# Patient Record
Sex: Female | Born: 2000 | Race: White | Hispanic: No | Smoking: Former smoker
Health system: Southern US, Community
[De-identification: ages and names within clinical notes are randomized; demographics above are authoritative.]

## PROBLEM LIST (undated history)

## (undated) DIAGNOSIS — Z789 Other specified health status: Secondary | ICD-10-CM

## (undated) DIAGNOSIS — O139 Gestational [pregnancy-induced] hypertension without significant proteinuria, unspecified trimester: Secondary | ICD-10-CM

## (undated) HISTORY — DX: Gestational (pregnancy-induced) hypertension without significant proteinuria, unspecified trimester: O13.9

## (undated) HISTORY — DX: Other specified health status: Z78.9

## (undated) HISTORY — PX: NO PAST SURGERIES: SHX2092

---
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
## 2020-07-06 NOTE — L&D Delivery Note (Addendum)
[facility]N [facility] Delivery Note  Dawn Blankenship is a 20 y.o. G1P0 s/p SVD at [redacted]w[redacted]d. She was admitted for SOL.   ROM: 20h 49m with Clear light meconium fluid GBS Status: Positive/Positive (10/12 0000) Maximum Maternal Temperature: 99.1  Labor Progress: Patient presented to L&D for SOL. Initial SVE: 4/90/-2. She then progressed to complete.   Delivery Date/Time: 05/14/21 @0947  Delivery: Called to room and patient was complete and pushing. Head delivered LOA. No nuchal cord present. Shoulder and body delivered in usual fashion. Infant with spontaneous cry, placed on mother's abdomen, dried and stimulated. Cord clamped x 2 after 1-minute delay, and cut by father of baby. Cord blood drawn. Placenta delivered spontaneously with gentle cord traction. Fundus firm with massage and Pitocin. Labia, perineum, vagina, and cervix inspected with 2nd degree vaginal laceration, right labial laceration, and left vaginal laceration requiring repair. Placenta was examined after and found to be intact with normal 3-vessel cord.   Placenta: Intact 3VC with multiple areas of calcification Complications: None Lacerations: 2nd degree vaginal repaired with 2-0 Vicryl rapide, right labial repaired with 3-0 Vicryl rapide, left vaginal laceration repaired with 2-0 Vicryl rapide EBL: Analgesia: Epidural and lidocaine  Postpartum Planning [X]  message to sent to schedule follow-up  [X]  vaccines UTD  Infant: APGARs 8/9  pending weight  , DO 05/14/2021, 11:25 AM PGY-1, [facility]  Midwife attestation: I was gloved and present for delivery in its entirety and I agree with the above resident's note.  10/9, CNM 1:33 PM

## 2020-09-13 ENCOUNTER — Ambulatory Visit (INDEPENDENT_AMBULATORY_CARE_PROVIDER_SITE_OTHER): Payer: Medicaid Other | Admitting: Adult Health

## 2020-09-13 ENCOUNTER — Encounter: Payer: Self-pay | Admitting: Adult Health

## 2020-09-13 ENCOUNTER — Other Ambulatory Visit: Payer: Self-pay

## 2020-09-13 VITALS — BP 133/72 | HR 89 | Ht 64.0 in | Wt 187.0 lb

## 2020-09-13 DIAGNOSIS — N926 Irregular menstruation, unspecified: Secondary | ICD-10-CM | POA: Insufficient documentation

## 2020-09-13 DIAGNOSIS — O3680X0 Pregnancy with inconclusive fetal viability, not applicable or unspecified: Secondary | ICD-10-CM | POA: Insufficient documentation

## 2020-09-13 DIAGNOSIS — Z3A01 Less than 8 weeks gestation of pregnancy: Secondary | ICD-10-CM | POA: Diagnosis not present

## 2020-09-13 DIAGNOSIS — O219 Vomiting of pregnancy, unspecified: Secondary | ICD-10-CM | POA: Insufficient documentation

## 2020-09-13 LAB — POCT URINE PREGNANCY: Preg Test, Ur: POSITIVE — AB

## 2020-09-13 MED ORDER — PROMETHAZINE HCL 25 MG PO TABS: 25.0000 mg | ORAL_TABLET | Freq: Four times a day (QID) | ORAL | 1 refills | Status: DC | PRN

## 2020-09-13 NOTE — Patient Instructions (Signed)
Obstetrics: Normal and Problem Pregnancies (7th ed., pp. 102-121). [place], [place]: Elsevier."> Textbook of Family Medicine (9th ed., pp. 365-410). [place], [place]: [employer].">  First Trimester of Pregnancy  The first trimester of pregnancy starts on the first day of your last menstrual period until the end of week 12. This is months 1 through 3 of pregnancy. A week after a sperm fertilizes an egg, the egg will implant into the wall of the uterus and begin to develop into a baby. By the end of 12 weeks, all the baby's organs will be formed and the baby will be 2-3 inches in size. Body changes during your first trimester Your body goes through many changes during pregnancy. The changes vary and generally return to normal after your baby is born. Physical changes  You may gain or lose weight.  Your breasts may begin to grow larger and become tender. The tissue that surrounds your nipples (areola) may become darker.  Dark spots or blotches (chloasma or mask of pregnancy) may develop on your face.  You may have changes in your hair. These can include thickening or thinning of your hair or changes in texture. Health changes  You may feel nauseous, and you may vomit.  You may have heartburn.  You may develop headaches.  You may develop constipation.  Your gums may bleed and may be sensitive to brushing and flossing. Other changes  You may tire easily.  You may urinate more often.  Your menstrual periods will stop.  You may have a loss of appetite.  You may develop cravings for certain kinds of food.  You may have changes in your emotions from day to day.  You may have more vivid and strange dreams. Follow these instructions at home: Medicines  Follow your health care provider's instructions regarding medicine use. Specific medicines may be either safe or unsafe to take during pregnancy. Do not take any medicines unless told to by your health care provider.  Take a  prenatal vitamin that contains at least 600 micrograms (mcg) of folic acid. Eating and drinking  Eat a healthy diet that includes fresh fruits and vegetables, whole grains, good sources of protein such as meat, eggs, or tofu, and low-fat dairy products.  Avoid raw meat and unpasteurized juice, milk, and cheese. These carry germs that can harm you and your baby.  If you feel nauseous or you vomit: ? Eat 4 or 5 small meals a day instead of 3 large meals. ? Try eating a few soda crackers. ? Drink liquids between meals instead of during meals.  You may need to take these actions to prevent or treat constipation: ? Drink enough fluid to keep your urine pale yellow. ? Eat foods that are high in fiber, such as beans, whole grains, and fresh fruits and vegetables. ? Limit foods that are high in fat and processed sugars, such as fried or sweet foods. Activity  Exercise only as directed by your health care provider. Most people can continue their usual exercise routine during pregnancy. Try to exercise for 30 minutes at least 5 days a week.  Stop exercising if you develop pain or cramping in the lower abdomen or lower back.  Avoid exercising if it is very hot or humid or if you are at high altitude.  Avoid heavy lifting.  If you choose to, you may have sex unless your health care provider tells you not to. Relieving pain and discomfort  Wear a good support bra to relieve breast   tenderness.  Rest with your legs elevated if you have leg cramps or low back pain.  If you develop bulging veins (varicose veins) in your legs: ? Wear support hose as told by your health care provider. ? Elevate your feet for 15 minutes, 3-4 times a day. ? Limit salt in your diet. Safety  Wear your seat belt at all times when driving or riding in a car.  Talk with your health care provider if someone is verbally or physically abusive to you.  Talk with your health care provider if you are feeling sad or have  thoughts of hurting yourself. Lifestyle  Do not use hot tubs, steam rooms, or saunas.  Do not douche. Do not use tampons or scented sanitary pads.  Do not use herbal remedies, alcohol, illegal drugs, or medicines that are not approved by your health care provider. Chemicals in these products can harm your baby.  Do not use any products that contain nicotine or tobacco, such as cigarettes, e-cigarettes, and chewing tobacco. If you need help quitting, ask your health care provider.  Avoid cat litter boxes and soil used by cats. These carry germs that can cause birth defects in the baby and possibly loss of the unborn baby (fetus) by miscarriage or stillbirth. General instructions  During routine prenatal visits in the first trimester, your health care provider will do a physical exam, perform necessary tests, and ask you how things are going. Keep all follow-up visits. This is important.  Ask for help if you have counseling or nutritional needs during pregnancy. Your health care provider can offer advice or refer you to specialists for help with various needs.  Schedule a dentist appointment. At home, brush your teeth with a soft toothbrush. Floss gently.  Write down your questions. Take them to your prenatal visits. Where to find more information  American Pregnancy Association: americanpregnancy.org  American College of Obstetricians and Gynecologists: acog.org/en/Womens%20Health/Pregnancy  Office on Women's Health: womenshealth.gov/pregnancy Contact a health care provider if you have:  Dizziness.  A fever.  Mild pelvic cramps, pelvic pressure, or nagging pain in the abdominal area.  Nausea, vomiting, or diarrhea that lasts for 24 hours or longer.  A bad-smelling vaginal discharge.  Pain when you urinate.  Known exposure to a contagious illness, such as chickenpox, measles, Zika virus, HIV, or hepatitis. Get help right away if you have:  Spotting or bleeding from your  vagina.  Severe abdominal cramping or pain.  Shortness of breath or chest pain.  Any kind of trauma, such as from a fall or a car crash.  New or increased pain, swelling, or redness in an arm or leg. Summary  The first trimester of pregnancy starts on the first day of your last menstrual period until the end of week 12 (months 1 through 3).  Eating 4 or 5 small meals a day rather than 3 large meals may help to relieve nausea and vomiting.  Do not use any products that contain nicotine or tobacco, such as cigarettes, e-cigarettes, and chewing tobacco. If you need help quitting, ask your health care provider.  Keep all follow-up visits. This is important. This information is not intended to replace advice given to you by your health care provider. Make sure you discuss any questions you have with your health care provider. Document Revised: 11/29/2019 Document Reviewed: 10/05/2019 Elsevier Patient Education  2021 Elsevier Inc.  

## 2020-09-13 NOTE — Progress Notes (Signed)
  Subjective:     Patient ID: Dawn Blankenship, female   DOB: August 22, 2000, 20 y.o.   MRN: [id number]  HPI Dawn Blankenship is a 20 year old white female, single, but engaged, in for UPT, has missed a period and had 4+HPTs. Has had nausea and vomiting and some cramping,   Review of Systems +missed period, with 4+HPTs +cramping +nasuea and vomiting +breast tenderness Reviewed past medical,surgical, social and family history. Reviewed medications and allergies.     Objective:   Physical Exam BP 133/72 (BP Location: Left Arm, Patient Position: Sitting, Cuff Size: Normal)   Pulse 89   Ht 5\' 4"  (1.626 m)   Wt 187 lb (84.8 kg)   LMP 08/01/2020 (Exact Date)   BMI 32.10 kg/m UPT is +, about 6+1 week by LMP with EDD 05/08/21.Skin warm and dry. Neck: mid line trachea, normal thyroid, good ROM, no lymphadenopathy noted. Lungs: clear to ausculation bilaterally. Cardiovascular: regular rate and rhythm. Abdomen is soft and non tender AA is 0 Fall risk is low PHQ 9 score is 4, no SI, no meds needed she says GAD 7 score is 5  [facility] - 09/13/20 1110      Pregnancy Intention Screening   Does the patient want to become pregnant in the next year? Yes    Does the patient's partner want to become pregnant in the next year? Yes    Would the patient like to discuss contraceptive options today? No      Contraception Wrap Up   Current Method Pregnant/Seeking Pregnancy    End Method Pregnant/Seeking Pregnancy    Contraception Counseling Provided No              Assessment:     1. Missed period UPT +  2. Less than [redacted] weeks gestation of pregnancy Continue PNV  3. Encounter to determine fetal viability of pregnancy, single or unspecified fetus Return in 3 weeks for dating 11/13/20  4. Nausea and vomiting during pregnancy prior to [redacted] weeks gestation Will rx phenergan Meds ordered this encounter  Medications  . promethazine (PHENERGAN) 25 MG tablet    Sig: Take 1 tablet (25 mg total) by mouth every  6 (six) hours as needed for nausea or vomiting.    Dispense:  30 tablet    Refill:  1    Order Specific Question:   Supervising Provider    Answer:   [place] [[id number]]      Plan:     Review handout on First trimester and by [facility]

## 2020-09-30 ENCOUNTER — Ambulatory Visit (INDEPENDENT_AMBULATORY_CARE_PROVIDER_SITE_OTHER): Payer: Medicaid Other

## 2020-09-30 ENCOUNTER — Other Ambulatory Visit: Payer: Self-pay

## 2020-09-30 DIAGNOSIS — O3680X0 Pregnancy with inconclusive fetal viability, not applicable or unspecified: Secondary | ICD-10-CM

## 2020-09-30 NOTE — Progress Notes (Signed)
[place] 8+4 wks,single IUP with YS,CRL 19.94 mm,fhr 173 bpm,normal ovaries

## 2020-10-07 ENCOUNTER — Other Ambulatory Visit: Payer: Medicaid Other

## 2020-10-22 ENCOUNTER — Other Ambulatory Visit: Payer: Self-pay | Admitting: Obstetrics & Gynecology

## 2020-10-22 DIAGNOSIS — Z3682 Encounter for antenatal screening for nuchal translucency: Secondary | ICD-10-CM

## 2020-10-23 ENCOUNTER — Ambulatory Visit: Payer: Medicaid Other | Admitting: *Deleted

## 2020-10-23 ENCOUNTER — Ambulatory Visit (INDEPENDENT_AMBULATORY_CARE_PROVIDER_SITE_OTHER): Payer: Medicaid Other

## 2020-10-23 ENCOUNTER — Other Ambulatory Visit: Payer: Self-pay

## 2020-10-23 ENCOUNTER — Encounter: Payer: Self-pay | Admitting: Advanced Practice Midwife

## 2020-10-23 ENCOUNTER — Ambulatory Visit (INDEPENDENT_AMBULATORY_CARE_PROVIDER_SITE_OTHER): Payer: Medicaid Other | Admitting: Advanced Practice Midwife

## 2020-10-23 VITALS — BP 122/60 | HR 67 | Wt 184.0 lb

## 2020-10-23 DIAGNOSIS — Z3682 Encounter for antenatal screening for nuchal translucency: Secondary | ICD-10-CM

## 2020-10-23 DIAGNOSIS — Z3401 Encounter for supervision of normal first pregnancy, first trimester: Secondary | ICD-10-CM | POA: Diagnosis not present

## 2020-10-23 DIAGNOSIS — Z3A11 11 weeks gestation of pregnancy: Secondary | ICD-10-CM

## 2020-10-23 DIAGNOSIS — Z3400 Encounter for supervision of normal first pregnancy, unspecified trimester: Secondary | ICD-10-CM | POA: Insufficient documentation

## 2020-10-23 LAB — POCT URINALYSIS DIPSTICK OB
Blood, UA: NEGATIVE
Glucose, UA: NEGATIVE
Ketones, UA: NEGATIVE
Leukocytes, UA: NEGATIVE
Nitrite, UA: NEGATIVE
POC,PROTEIN,UA: NEGATIVE

## 2020-10-23 MED ORDER — ASPIRIN EC 81 MG PO TBEC: 162.0000 mg | DELAYED_RELEASE_TABLET | Freq: Every day | ORAL | 6 refills | Status: DC

## 2020-10-23 NOTE — Progress Notes (Signed)
[place] 11+6 wks,measurements c/w dates,crl 55.72 mm,fhr 164 bpm,posterior placenta,normal ovaries,NT 1.2 mm,NB present

## 2020-10-23 NOTE — Patient Instructions (Signed)
Dawn Blankenship, I greatly value your feedback.  If you receive a survey following your visit with [place] today, we appreciate you taking the time to fill it out.  Thanks, Dawn Beams, DNP, CNM  [facility] HAS MOVED!!! It is now [facility] at [facility] ([address] [place], [place] [address]) Entrance located off of [address] Free 24/7 valet parking   Nausea & Vomiting  Have saltine crackers or pretzels by your bed and eat a few bites before you raise your head out of bed in the morning  Eat small frequent meals throughout the day instead of large meals  Drink plenty of fluids throughout the day to stay hydrated, just don't drink a lot of fluids with your meals.  This can make your stomach fill up faster making you feel sick  Do not brush your teeth right after you eat  Products with real ginger are good for nausea, like ginger ale and ginger hard candy Make sure it says made with real ginger!  Sucking on sour candy like lemon heads is also good for nausea  If your prenatal vitamins make you nauseated, take them at night so you will sleep through the nausea  Sea Bands  If you feel like you need medicine for the nausea & vomiting please let us know  If you are unable to keep any fluids or food down please let us know   Constipation  Drink plenty of fluid, preferably water, throughout the day  Eat foods high in fiber such as fruits, vegetables, and grains  Exercise, such as walking, is a good way to keep your bowels regular  Drink warm fluids, especially warm prune juice, or decaf coffee  Eat a 1/2 cup of real oatmeal (not instant), 1/2 cup applesauce, and 1/2-1 cup warm prune juice every day  If needed, you may take Colace (docusate sodium) stool softener once or twice a day to help keep the stool soft.   If you still are having problems with constipation, you may take Miralax once daily as needed to help keep your bowels regular.    Home Blood Pressure Monitoring for Patients   Your provider has recommended that you check your blood pressure (BP) at least once a week at home. If you do not have a blood pressure cuff at home, one will be provided for you. Contact your provider if you have not received your monitor within 1 week.   Helpful Tips for Accurate Home Blood Pressure Checks  . Don't smoke, exercise, or drink caffeine 30 minutes before checking your BP . Use the restroom before checking your BP (a full bladder can raise your pressure) . Relax in a comfortable upright chair . Feet on the ground . Left arm resting comfortably on a flat surface at the level of your heart . Legs uncrossed . Back supported . Sit quietly and don't talk . Place the cuff on your bare arm . Adjust snuggly, so that only two fingertips can fit between your skin and the top of the cuff . Check 2 readings separated by at least one minute . Keep a log of your BP readings . For a visual, please reference this diagram: http://ccnc.care/bpdiagram  Provider Name: [facility]     Phone: [phone]  Zone 1: ALL CLEAR  Continue to monitor your symptoms:  . BP reading is less than 140 (top number) or less than 90 (bottom number)  . No right upper stomach  pain . No headaches or seeing spots . No feeling nauseated or throwing up . No swelling in face and hands  Zone 2: CAUTION Call your doctor's office for any of the following:  . BP reading is greater than 140 (top number) or greater than 90 (bottom number)  . Stomach pain under your ribs in the middle or right side . Headaches or seeing spots . Feeling nauseated or throwing up . Swelling in face and hands  Zone 3: EMERGENCY  Seek immediate medical care if you have any of the following:  . BP reading is greater than160 (top number) or greater than 110 (bottom number) . Severe headaches not improving with Tylenol . Serious difficulty catching your breath . Any worsening  symptoms from Zone 2    First Trimester of Pregnancy The first trimester of pregnancy is from week 1 until the end of week 12 (months 1 through 3). A week after a sperm fertilizes an egg, the egg will implant on the wall of the uterus. This embryo will begin to develop into a baby. Genes from you and your partner are forming the baby. The female genes determine whether the baby is a boy or a girl. At 6-8 weeks, the eyes and face are formed, and the heartbeat can be seen on ultrasound. At the end of 12 weeks, all the baby's organs are formed.  Now that you are pregnant, you will want to do everything you can to have a healthy baby. Two of the most important things are to get good prenatal care and to follow your health care provider's instructions. Prenatal care is all the medical care you receive before the baby's birth. This care will help prevent, find, and treat any problems during the pregnancy and childbirth. BODY CHANGES Your body goes through many changes during pregnancy. The changes vary from woman to woman.   You may gain or lose a couple of pounds at first.  You may feel sick to your stomach (nauseous) and throw up (vomit). If the vomiting is uncontrollable, call your health care provider.  You may tire easily.  You may develop headaches that can be relieved by medicines approved by your health care provider.  You may urinate more often. Painful urination may mean you have a bladder infection.  You may develop heartburn as a result of your pregnancy.  You may develop constipation because certain hormones are causing the muscles that push waste through your intestines to slow down.  You may develop hemorrhoids or swollen, bulging veins (varicose veins).  Your breasts may begin to grow larger and become tender. Your nipples may stick out more, and the tissue that surrounds them (areola) may become darker.  Your gums may bleed and may be sensitive to brushing and flossing.  Dark  spots or blotches (chloasma, mask of pregnancy) may develop on your face. This will likely fade after the baby is born.  Your menstrual periods will stop.  You may have a loss of appetite.  You may develop cravings for certain kinds of food.  You may have changes in your emotions from day to day, such as being excited to be pregnant or being concerned that something may go wrong with the pregnancy and baby.  You may have more vivid and strange dreams.  You may have changes in your hair. These can include thickening of your hair, rapid growth, and changes in texture. Some women also have hair loss during or after pregnancy, or hair that  feels dry or thin. Your hair will most likely return to normal after your baby is born. WHAT TO EXPECT AT YOUR PRENATAL VISITS During a routine prenatal visit:  You will be weighed to make sure you and the baby are growing normally.  Your blood pressure will be taken.  Your abdomen will be measured to track your baby's growth.  The fetal heartbeat will be listened to starting around week 10 or 12 of your pregnancy.  Test results from any previous visits will be discussed. Your health care provider may ask you:  How you are feeling.  If you are feeling the baby move.  If you have had any abnormal symptoms, such as leaking fluid, bleeding, severe headaches, or abdominal cramping.  If you have any questions. Other tests that may be performed during your first trimester include:  Blood tests to find your blood type and to check for the presence of any previous infections. They will also be used to check for low iron levels (anemia) and Rh antibodies. Later in the pregnancy, blood tests for diabetes will be done along with other tests if problems develop.  Urine tests to check for infections, diabetes, or protein in the urine.  An ultrasound to confirm the proper growth and development of the baby.  An amniocentesis to check for possible genetic  problems.  Fetal screens for spina bifida and Down syndrome.  You may need other tests to make sure you and the baby are doing well. HOME CARE INSTRUCTIONS  Medicines  Follow your health care provider's instructions regarding medicine use. Specific medicines may be either safe or unsafe to take during pregnancy.  Take your prenatal vitamins as directed.  If you develop constipation, try taking a stool softener if your health care provider approves. Diet  Eat regular, well-balanced meals. Choose a variety of foods, such as meat or vegetable-based protein, fish, milk and low-fat dairy products, vegetables, fruits, and whole grain breads and cereals. Your health care provider will help you determine the amount of weight gain that is right for you.  Avoid raw meat and uncooked cheese. These carry germs that can cause birth defects in the baby.  Eating four or five small meals rather than three large meals a day may help relieve nausea and vomiting. If you start to feel nauseous, eating a few soda crackers can be helpful. Drinking liquids between meals instead of during meals also seems to help nausea and vomiting.  If you develop constipation, eat more high-fiber foods, such as fresh vegetables or fruit and whole grains. Drink enough fluids to keep your urine clear or pale yellow. Activity and Exercise  Exercise only as directed by your health care provider. Exercising will help you:  Control your weight.  Stay in shape.  Be prepared for labor and delivery.  Experiencing pain or cramping in the lower abdomen or low back is a good sign that you should stop exercising. Check with your health care provider before continuing normal exercises.  Try to avoid standing for long periods of time. Move your legs often if you must stand in one place for a long time.  Avoid heavy lifting.  Wear low-heeled shoes, and practice good posture.  You may continue to have sex unless your health care  provider directs you otherwise. Relief of Pain or Discomfort  Wear a good support bra for breast tenderness.    Take warm sitz baths to soothe any pain or discomfort caused by hemorrhoids. Use hemorrhoid cream  if your health care provider approves.    Rest with your legs elevated if you have leg cramps or low back pain.  If you develop varicose veins in your legs, wear support hose. Elevate your feet for 15 minutes, 3-4 times a day. Limit salt in your diet. Prenatal Care  Schedule your prenatal visits by the twelfth week of pregnancy. They are usually scheduled monthly at first, then more often in the last 2 months before delivery.  Write down your questions. Take them to your prenatal visits.  Keep all your prenatal visits as directed by your health care provider. Safety  Wear your seat belt at all times when driving.  Make a list of emergency phone numbers, including numbers for family, friends, the hospital, and police and fire departments. General Tips  Ask your health care provider for a referral to a local prenatal education class. Begin classes no later than at the beginning of month 6 of your pregnancy.  Ask for help if you have counseling or nutritional needs during pregnancy. Your health care provider can offer advice or refer you to specialists for help with various needs.  Do not use hot tubs, steam rooms, or saunas.  Do not douche or use tampons or scented sanitary pads.  Do not cross your legs for long periods of time.  Avoid cat litter boxes and soil used by cats. These carry germs that can cause birth defects in the baby and possibly loss of the fetus by miscarriage or stillbirth.  Avoid all smoking, herbs, alcohol, and medicines not prescribed by your health care provider. Chemicals in these affect the formation and growth of the baby.  Schedule a dentist appointment. At home, brush your teeth with a soft toothbrush and be gentle when you floss. SEEK MEDICAL  CARE IF:   You have dizziness.  You have mild pelvic cramps, pelvic pressure, or nagging pain in the abdominal area.  You have persistent nausea, vomiting, or diarrhea.  You have a bad smelling vaginal discharge.  You have pain with urination.  You notice increased swelling in your face, hands, legs, or ankles. SEEK IMMEDIATE MEDICAL CARE IF:   You have a fever.  You are leaking fluid from your vagina.  You have spotting or bleeding from your vagina.  You have severe abdominal cramping or pain.  You have rapid weight gain or loss.  You vomit blood or material that looks like coffee grounds.  You are exposed to [place] measles and have never had them.  You are exposed to fifth disease or chickenpox.  You develop a severe headache.  You have shortness of breath.  You have any kind of trauma, such as from a fall or a car accident. Document Released: 06/16/2001 Document Revised: 11/06/2013 Document Reviewed: 05/02/2013 [facility] Patient Information 2015 [place], [place]. This information is not intended to replace advice given to you by your health care provider. Make sure you discuss any questions you have with your health care provider.  Coronavirus (COVID-19) Are you at risk?  Are you at risk for the Coronavirus (COVID-19)?  To be considered HIGH RISK for Coronavirus (COVID-19), you have to meet the following criteria:  . Traveled to [place], [place], [place], [place] or [place];  and have fever, cough, and shortness of breath within the last 2 weeks of travel OR . Been in close contact with a person diagnosed with COVID-19 within the last 2 weeks and have fever, cough, and shortness of breath . IF YOU DO  NOT MEET THESE CRITERIA, YOU ARE CONSIDERED LOW RISK FOR COVID-19.  What to do if you are HIGH RISK for COVID-19?  Marland Kitchen If you are having a medical emergency, call 911. . Seek medical care right away. Before you go to a doctor's office, urgent care or emergency department,  call ahead and tell them about your recent travel, contact with someone diagnosed with COVID-19, and your symptoms. You should receive instructions from your physician's office regarding next steps of care.  . When you arrive at healthcare provider, tell the healthcare staff immediately you have returned from visiting [place], [place], [place], [place] or [place]; in the last two weeks or you have been in close contact with a person diagnosed with COVID-19 in the last 2 weeks.   . Tell the health care staff about your symptoms: fever, cough and shortness of breath. . After you have been seen by a medical provider, you will be either: o Tested for (COVID-19) and discharged home on quarantine except to seek medical care if symptoms worsen, and asked to  - Stay home and avoid contact with others until you get your results (4-5 days)  - Avoid travel on public transportation if possible (such as bus, train, or airplane) or o Sent to the Emergency Department by EMS for evaluation, COVID-19 testing, and possible admission depending on your condition and test results.  What to do if you are LOW RISK for COVID-19?  Reduce your risk of any infection by using the same precautions used for avoiding the common cold or flu:  Marland Kitchen Wash your hands often with soap and warm water for at least 20 seconds.  If soap and water are not readily available, use an alcohol-based hand sanitizer with at least 60% alcohol.  . If coughing or sneezing, cover your mouth and nose by coughing or sneezing into the elbow areas of your shirt or coat, into a tissue or into your sleeve (not your hands). . Avoid shaking hands with others and consider head nods or verbal greetings only. . Avoid touching your eyes, nose, or mouth with unwashed hands.  . Avoid close contact with people who are sick. . Avoid places or events with large numbers of people in one location, like concerts or sporting events. . Carefully consider travel plans you have or  are making. . If you are planning any travel outside or inside the [place], visit the CDC's Travelers' Health webpage for the latest health notices. . If you have some symptoms but not all symptoms, continue to monitor at home and seek medical attention if your symptoms worsen. . If you are having a medical emergency, call 911.   [place] / e-Visit: [email or web address]         [facility]: [facility]: [phone]                   [facility]: [phone]     Safe Medications in Pregnancy   Acne: Benzoyl Peroxide Salicylic Acid  Backache/Headache: Tylenol: 2 regular strength every 4 hours OR              2 Extra strength every 6 hours  Colds/Coughs/Allergies: Benadryl (alcohol free) 25 mg every 6 hours as needed Breath right strips Claritin Cepacol throat lozenges Chloraseptic throat spray Cold-Eeze- up to three times per day Cough drops, alcohol free Flonase (by prescription only) Guaifenesin Mucinex Robitussin DM (plain only, alcohol  free) Saline nasal spray/drops Sudafed (pseudoephedrine) & Actifed ** use only after [redacted] weeks gestation and if you do not have high blood pressure Tylenol Vicks Vaporub Zinc lozenges Zyrtec   Constipation: Colace Ducolax suppositories Fleet enema Glycerin suppositories Metamucil Milk of magnesia Miralax Senokot Smooth move tea  Diarrhea: Kaopectate Imodium A-D  *NO pepto Bismol  Hemorrhoids: Anusol Anusol HC Preparation H Tucks  Indigestion: Tums Maalox Mylanta Zantac  Pepcid  Insomnia: Benadryl (alcohol free) 25mg  every 6 hours as needed Tylenol PM Unisom, no Gelcaps  Leg Cramps: Tums MagGel  Nausea/Vomiting:  Bonine Dramamine Emetrol Ginger extract Sea bands Meclizine  Nausea medication to take during pregnancy:  Unisom (doxylamine succinate  25 mg tablets) Take one tablet daily at bedtime. If symptoms are not adequately controlled, the dose can be increased to a maximum recommended dose of two tablets daily (1/2 tablet in the morning, 1/2 tablet mid-afternoon and one at bedtime). Vitamin B6 100mg  tablets. Take one tablet twice a day (up to 200 mg per day).  Skin Rashes: Aveeno products Benadryl cream or 25mg  every 6 hours as needed Calamine Lotion 1% cortisone cream  Yeast infection: Gyne-lotrimin 7 Monistat 7   **If taking multiple medications, please check labels to avoid duplicating the same active ingredients **take medication as directed on the label ** Do not exceed 4000 mg of tylenol in 24 hours **Do not take medications that contain aspirin or ibuprofen

## 2020-10-23 NOTE — Progress Notes (Signed)
INITIAL OBSTETRICAL VISIT Patient name: Dawn Blankenship MRN [id number]  Date of birth: [birth year] Chief Complaint:   Initial Prenatal Visit (Nt/it)  History of Present Illness:   Dawn Blankenship is a 20 y.o. G1P0  female at [redacted]w[redacted]d by LMP c/w u/s at 8 weeks with an Estimated Date of Delivery: 05/08/21 being seen today for her initial obstetrical visit.   Her obstetrical history is significant for first pregnancy.   Today she reports heartburn.  Depression screen [facility] 2/9 10/23/2020 09/13/2020  Decreased Interest 0 1  Down, Depressed, Hopeless 1 0  PHQ - 2 Score 1 1  Altered sleeping 1 1  Tired, decreased energy 1 1  Change in appetite 0 0  Feeling bad or failure about yourself  1 0  Trouble concentrating 1 1  Moving slowly or fidgety/restless 0 0  Suicidal thoughts 0 0  PHQ-9 Score 5 4    Patient's last menstrual period was 08/01/2020 (exact date). Last pap n/a. Review of Systems:   Pertinent items are noted in HPI Denies cramping/contractions, leakage of fluid, vaginal bleeding, abnormal vaginal discharge w/ itching/odor/irritation, headaches, visual changes, shortness of breath, chest pain, abdominal pain, severe nausea/vomiting, or problems with urination or bowel movements unless otherwise stated above.  Pertinent History Reviewed:  Reviewed past medical,surgical, social, obstetrical and family history.  Reviewed problem list, medications and allergies. OB History  Gravida Para Term Preterm AB Living  1            SAB IAB Ectopic Multiple Live Births               # Outcome Date GA Lbr Len/2nd Weight Sex Delivery Anes PTL Lv  1 Current            Physical Assessment:   Vitals:   10/23/20 1057  BP: 122/60  Pulse: 67  Weight: 184 lb (83.5 kg)  Body mass index is 31.58 kg/m.       Physical Examination:  General appearance - well appearing, and in no distress  Mental status - alert, oriented to person, place, and time  Psych:  She has a normal mood and  affect  Skin - warm and dry, normal color, no suspicious lesions noted  Chest - effort normal  Heart - normal rate and regular rhythm  Abdomen - soft, nontender  Extremities:  No swelling or varicosities noted    TODAY'S NT [place] 11+6 wks,measurements c/w dates,crl 55.72 mm,fhr 164 bpm,posterior placenta,normal ovaries,NT 1.2 mm,NB present   Results for orders placed or performed in visit on 10/23/20 (from the past 24 hour(s))  POC Urinalysis Dipstick OB   Collection Time: 10/23/20 11:27 AM  Result Value Ref Range   Color, UA     Clarity, UA     Glucose, UA Negative Negative   Bilirubin, UA     Ketones, UA neg    Spec Grav, UA     Blood, UA neg    pH, UA     POC,PROTEIN,UA Negative Negative, Trace, Small (1+), Moderate (2+), Large (3+), 4+   Urobilinogen, UA     Nitrite, UA neg    Leukocytes, UA Negative Negative   Appearance     Odor      Assessment & Plan:  1) Low-Risk Pregnancy G1P0 at [redacted]w[redacted]d with an Estimated Date of Delivery: 05/08/21   2) Initial OB visit  3) Mod risk for PreE:  [place]/BMI>30;  Start asa  Meds: No orders of the defined types were placed in  this encounter.   Initial labs obtained Continue prenatal vitamins Reviewed n/v relief measures and warning s/s to report Reviewed recommended weight gain based on pre-gravid BMI Encouraged well-balanced diet Genetic & carrier screening discussed: requests Panorama, NT/IT and Horizon 14 , declines AFP Ultrasound discussed; fetal survey: requested [facility] completed> form faxed if has or is planning to apply for medicaid The nature of [place] [facility] [employer] with multiple MDs and other Advanced Practice Providers was explained to patient; also emphasized that fellows, residents, and students are part of our team. Given home bp cuff. Check bp weekly, let us know if >140/90.        Scarlette Calico Cresenzo-Dishmon 11:35 AM

## 2020-10-24 ENCOUNTER — Telehealth: Payer: Self-pay

## 2020-10-24 LAB — INTEGRATED 1

## 2020-10-24 LAB — GC/CHLAMYDIA PROBE AMP
Chlamydia trachomatis, NAA: NEGATIVE
Neisseria Gonorrhoeae by PCR: NEGATIVE

## 2020-10-24 LAB — CBC/D/PLT+RPR+RH+ABO+RUB AB...
EOS (ABSOLUTE): 0.1 10*3/uL (ref 0.0–0.4)
Eos: 1 %
Hematocrit: 37.1 % (ref 34.0–46.6)
MCV: 86 fL (ref 79–97)

## 2020-10-24 LAB — PMP SCREEN PROFILE (10S), URINE
Amphetamine Scrn, Ur: NEGATIVE ng/mL
BARBITURATE SCREEN URINE: NEGATIVE ng/mL
BENZODIAZEPINE SCREEN, URINE: NEGATIVE ng/mL
CANNABINOIDS UR QL SCN: NEGATIVE ng/mL
Cocaine (Metab) Scrn, Ur: NEGATIVE ng/mL
Creatinine(Crt), U: 44.7 mg/dL (ref 20.0–300.0)
Methadone Screen, Urine: NEGATIVE ng/mL
OXYCODONE+OXYMORPHONE UR QL SCN: NEGATIVE ng/mL
Opiate Scrn, Ur: NEGATIVE ng/mL
Ph of Urine: 8 (ref 4.5–8.9)
Phencyclidine Qn, Ur: NEGATIVE ng/mL
Propoxyphene Scrn, Ur: NEGATIVE ng/mL

## 2020-10-24 NOTE — Telephone Encounter (Signed)
Pt called with light spotting and no cramping starte dlate in the AM on 10/24/20

## 2020-10-25 LAB — INTEGRATED 1
Crown Rump Length: 55.7 mm
Gest. Age on Collection Date: 12 weeks
Maternal Age at EDD: 20 yr
Nuchal Translucency (NT): 1.2 mm
Number of Fetuses: 1
PAPP-A Value: 416.1 ng/mL
Weight: 184 [lb_av]

## 2020-10-25 LAB — CBC/D/PLT+RPR+RH+ABO+RUB AB...
Antibody Screen: NEGATIVE
Basophils Absolute: 0 10*3/uL (ref 0.0–0.2)
Basos: 0 %
HCV Ab: 0.2 s/co ratio (ref 0.0–0.9)
HIV Screen 4th Generation wRfx: NONREACTIVE
Hemoglobin: 12.3 g/dL (ref 11.1–15.9)
Hepatitis B Surface Ag: NEGATIVE
Immature Grans (Abs): 0 10*3/uL (ref 0.0–0.1)
Immature Granulocytes: 0 %
Lymphocytes Absolute: 2.8 10*3/uL (ref 0.7–3.1)
Lymphs: 28 %
MCH: 28.4 pg (ref 26.6–33.0)
MCHC: 33.2 g/dL (ref 31.5–35.7)
Monocytes Absolute: 0.6 10*3/uL (ref 0.1–0.9)
Monocytes: 6 %
Neutrophils Absolute: 6.4 10*3/uL (ref 1.4–7.0)
Neutrophils: 65 %
Platelets: 333 10*3/uL (ref 150–450)
RBC: 4.33 x10E6/uL (ref 3.77–5.28)
RDW: 12.4 % (ref 11.7–15.4)
RPR Ser Ql: NONREACTIVE
Rh Factor: POSITIVE
Rubella Antibodies, IGG: 3.47 index (ref >0.99)
WBC: 9.9 10*3/uL (ref 3.4–10.8)

## 2020-10-25 LAB — URINE CULTURE

## 2020-10-25 LAB — HCV INTERPRETATION

## 2020-11-20 ENCOUNTER — Ambulatory Visit (INDEPENDENT_AMBULATORY_CARE_PROVIDER_SITE_OTHER): Payer: Self-pay | Admitting: Advanced Practice Midwife

## 2020-11-20 ENCOUNTER — Other Ambulatory Visit: Payer: Self-pay

## 2020-11-20 ENCOUNTER — Encounter: Payer: Self-pay | Admitting: Advanced Practice Midwife

## 2020-11-20 VITALS — BP 130/64 | HR 79 | Wt 184.0 lb

## 2020-11-20 DIAGNOSIS — Z3A15 15 weeks gestation of pregnancy: Secondary | ICD-10-CM

## 2020-11-20 DIAGNOSIS — Z363 Encounter for antenatal screening for malformations: Secondary | ICD-10-CM

## 2020-11-20 DIAGNOSIS — Z3402 Encounter for supervision of normal first pregnancy, second trimester: Secondary | ICD-10-CM

## 2020-11-20 NOTE — Patient Instructions (Signed)
Dawn Blankenship, I greatly value your feedback.  If you receive a survey following your visit with [place] today, we appreciate you taking the time to fill it out.  Thanks, Philipp Deputy, CNM  [facility] ([address] [place], [place] [address]) Entrance C, located off of [address] valet parking  Go to [email or web address] to register for FREE online childbirth classes  [place] Pediatricians/Family Doctors:  [facility] [phone]            [facility] [phone]                 [facility] [phone] (usually not accepting new patients unless you have family there already, you are always welcome to call and ask)       [facility] Department [phone]       [facility] Pediatricians/Family Doctors:   [facility]: [phone]  [facility]: [phone]  [facility]: [phone]  [facility] Doctors:   [facility]: [phone]   [facility]: [phone]  [facility] Doctors:  [facility]: [phone]    Home Blood Pressure Monitoring for Patients   Your provider has recommended that you check your blood pressure (BP) at least once a week at home. If you do not have a blood pressure cuff at home, one will be provided for you. Contact your provider if you have not received your monitor within 1 week.   Helpful Tips for Accurate Home Blood Pressure Checks  . Don't smoke, exercise, or drink caffeine 30 minutes before checking your BP . Use the restroom before checking your BP (a full bladder can raise your pressure) . Relax in a comfortable upright chair . Feet on the ground . Left arm resting comfortably on a flat surface at the level of your heart . Legs uncrossed . Back supported . Sit quietly and don't talk . Place the cuff on your bare arm . Adjust snuggly, so that  only two fingertips can fit between your skin and the top of the cuff . Check 2 readings separated by at least one minute . Keep a log of your BP readings . For a visual, please reference this diagram: http://ccnc.care/bpdiagram  Provider Name: [facility]     Phone: [phone]  Zone 1: ALL CLEAR  Continue to monitor your symptoms:  . BP reading is less than 140 (top number) or less than 90 (bottom number)  . No right upper stomach pain . No headaches or seeing spots . No feeling nauseated or throwing up . No swelling in face and hands  Zone 2: CAUTION Call your doctor's office for any of the following:  . BP reading is greater than 140 (top number) or greater than 90 (bottom number)  . Stomach pain under your ribs in the middle or right side . Headaches or seeing spots . Feeling nauseated or throwing up . Swelling in face and hands  Zone 3: EMERGENCY  Seek immediate medical care if you have any of the following:  . BP reading is greater than160 (top number) or greater than 110 (bottom number) . Severe headaches not improving with Tylenol . Serious difficulty catching your breath . Any worsening symptoms from Zone 2     Second Trimester of Pregnancy The second trimester is from week 14 through week 27 (months 4 through 6). The second trimester is often a time when you feel your best.  Your body has adjusted to being pregnant, and you begin to feel better physically. Usually, morning sickness has lessened or quit completely, you may have more energy, and you may have an increase in appetite. The second trimester is also a time when the fetus is growing rapidly. At the end of the sixth month, the fetus is about 9 inches long and weighs about 1 pounds. You will likely begin to feel the baby move (quickening) between 16 and 20 weeks of pregnancy. Body changes during your second trimester Your body continues to go through many changes during your second trimester. The changes  vary from woman to woman.  Your weight will continue to increase. You will notice your lower abdomen bulging out.  You may begin to get stretch marks on your hips, abdomen, and breasts.  You may develop headaches that can be relieved by medicines. The medicines should be approved by your health care provider.  You may urinate more often because the fetus is pressing on your bladder.  You may develop or continue to have heartburn as a result of your pregnancy.  You may develop constipation because certain hormones are causing the muscles that push waste through your intestines to slow down.  You may develop hemorrhoids or swollen, bulging veins (varicose veins).  You may have back pain. This is caused by: ? Weight gain. ? Pregnancy hormones that are relaxing the joints in your pelvis. ? A shift in weight and the muscles that support your balance.  Your breasts will continue to grow and they will continue to become tender.  Your gums may bleed and may be sensitive to brushing and flossing.  Dark spots or blotches (chloasma, mask of pregnancy) may develop on your face. This will likely fade after the baby is born.  A dark line from your belly button to the pubic area (linea nigra) may appear. This will likely fade after the baby is born.  You may have changes in your hair. These can include thickening of your hair, rapid growth, and changes in texture. Some women also have hair loss during or after pregnancy, or hair that feels dry or thin. Your hair will most likely return to normal after your baby is born.  What to expect at prenatal visits During a routine prenatal visit:  You will be weighed to make sure you and the fetus are growing normally.  Your blood pressure will be taken.  Your abdomen will be measured to track your baby's growth.  The fetal heartbeat will be listened to.  Any test results from the previous visit will be discussed.  Your health care provider may  ask you:  How you are feeling.  If you are feeling the baby move.  If you have had any abnormal symptoms, such as leaking fluid, bleeding, severe headaches, or abdominal cramping.  If you are using any tobacco products, including cigarettes, chewing tobacco, and electronic cigarettes.  If you have any questions.  Other tests that may be performed during your second trimester include:  Blood tests that check for: ? Low iron levels (anemia). ? High blood sugar that affects pregnant women (gestational diabetes) between 73 and 28 weeks. ? Rh antibodies. This is to check for a protein on red blood cells (Rh factor).  Urine tests to check for infections, diabetes, or protein in the urine.  An ultrasound to confirm the proper growth and development of the baby.  An amniocentesis to check for possible genetic problems.  Fetal screens  for spina bifida and Down syndrome.  HIV (human immunodeficiency virus) testing. Routine prenatal testing includes screening for HIV, unless you choose not to have this test.  Follow these instructions at home: Medicines  Follow your health care provider's instructions regarding medicine use. Specific medicines may be either safe or unsafe to take during pregnancy.  Take a prenatal vitamin that contains at least 600 micrograms (mcg) of folic acid.  If you develop constipation, try taking a stool softener if your health care provider approves. Eating and drinking  Eat a balanced diet that includes fresh fruits and vegetables, whole grains, good sources of protein such as meat, eggs, or tofu, and low-fat dairy. Your health care provider will help you determine the amount of weight gain that is right for you.  Avoid raw meat and uncooked cheese. These carry germs that can cause birth defects in the baby.  If you have low calcium intake from food, talk to your health care provider about whether you should take a daily calcium supplement.  Limit foods  that are high in fat and processed sugars, such as fried and sweet foods.  To prevent constipation: ? Drink enough fluid to keep your urine clear or pale yellow. ? Eat foods that are high in fiber, such as fresh fruits and vegetables, whole grains, and beans. Activity  Exercise only as directed by your health care provider. Most women can continue their usual exercise routine during pregnancy. Try to exercise for 30 minutes at least 5 days a week. Stop exercising if you experience uterine contractions.  Avoid heavy lifting, wear low heel shoes, and practice good posture.  A sexual relationship may be continued unless your health care provider directs you otherwise. Relieving pain and discomfort  Wear a good support bra to prevent discomfort from breast tenderness.  Take warm sitz baths to soothe any pain or discomfort caused by hemorrhoids. Use hemorrhoid cream if your health care provider approves.  Rest with your legs elevated if you have leg cramps or low back pain.  If you develop varicose veins, wear support hose. Elevate your feet for 15 minutes, 3-4 times a day. Limit salt in your diet. Prenatal Care  Write down your questions. Take them to your prenatal visits.  Keep all your prenatal visits as told by your health care provider. This is important. Safety  Wear your seat belt at all times when driving.  Make a list of emergency phone numbers, including numbers for family, friends, the hospital, and police and fire departments. General instructions  Ask your health care provider for a referral to a local prenatal education class. Begin classes no later than the beginning of month 6 of your pregnancy.  Ask for help if you have counseling or nutritional needs during pregnancy. Your health care provider can offer advice or refer you to specialists for help with various needs.  Do not use hot tubs, steam rooms, or saunas.  Do not douche or use tampons or scented sanitary  pads.  Do not cross your legs for long periods of time.  Avoid cat litter boxes and soil used by cats. These carry germs that can cause birth defects in the baby and possibly loss of the fetus by miscarriage or stillbirth.  Avoid all smoking, herbs, alcohol, and unprescribed drugs. Chemicals in these products can affect the formation and growth of the baby.  Do not use any products that contain nicotine or tobacco, such as cigarettes and e-cigarettes. If you need help quitting,  ask your health care provider.  Visit your dentist if you have not gone yet during your pregnancy. Use a soft toothbrush to brush your teeth and be gentle when you floss. Contact a health care provider if:  You have dizziness.  You have mild pelvic cramps, pelvic pressure, or nagging pain in the abdominal area.  You have persistent nausea, vomiting, or diarrhea.  You have a bad smelling vaginal discharge.  You have pain when you urinate. Get help right away if:  You have a fever.  You are leaking fluid from your vagina.  You have spotting or bleeding from your vagina.  You have severe abdominal cramping or pain.  You have rapid weight gain or weight loss.  You have shortness of breath with chest pain.  You notice sudden or extreme swelling of your face, hands, ankles, feet, or legs.  You have not felt your baby move in over an hour.  You have severe headaches that do not go away when you take medicine.  You have vision changes. Summary  The second trimester is from week 14 through week 27 (months 4 through 6). It is also a time when the fetus is growing rapidly.  Your body goes through many changes during pregnancy. The changes vary from woman to woman.  Avoid all smoking, herbs, alcohol, and unprescribed drugs. These chemicals affect the formation and growth your baby.  Do not use any tobacco products, such as cigarettes, chewing tobacco, and e-cigarettes. If you need help quitting, ask your  health care provider.  Contact your health care provider if you have any questions. Keep all prenatal visits as told by your health care provider. This is important. This information is not intended to replace advice given to you by your health care provider. Make sure you discuss any questions you have with your health care provider. Document Released: 06/16/2001 Document Revised: 11/28/2015 Document Reviewed: 08/23/2012 Elsevier Interactive Patient Education  2017 [employer].

## 2020-11-20 NOTE — Progress Notes (Signed)
   LOW-RISK PREGNANCY VISIT Patient name: Dawn Blankenship MRN [id number]  Date of birth: September 30, 2000 Chief Complaint:   Routine Prenatal Visit  History of Present Illness:   Dawn Blankenship is a 20 y.o. G1P0 female at [redacted]w[redacted]d with an Estimated Date of Delivery: 05/08/21 being seen today for ongoing management of a low-risk pregnancy.  Today she reports no complaints. Contractions: Not present. Vag. Bleeding: None.  Movement: Present. denies leaking of fluid. Review of Systems:   Pertinent items are noted in HPI Denies abnormal vaginal discharge w/ itching/odor/irritation, headaches, visual changes, shortness of breath, chest pain, abdominal pain, severe nausea/vomiting, or problems with urination or bowel movements unless otherwise stated above. Pertinent History Reviewed:  Reviewed past medical,surgical, social, obstetrical and family history.  Reviewed problem list, medications and allergies. Physical Assessment:   Vitals:   11/20/20 1032  BP: 130/64  Pulse: 79  Weight: 184 lb (83.5 kg)  Body mass index is 31.58 kg/m.        Physical Examination:   General appearance: Well appearing, and in no distress  Mental status: Alert, oriented to person, place, and time  Skin: Warm & dry  Cardiovascular: Normal heart rate noted  Respiratory: Normal respiratory effort, no distress  Abdomen: Soft, gravid, nontender  Pelvic: Cervical exam deferred         Extremities: Edema: None  Fetal Status: Fetal Heart Rate (bpm): 147   Movement: Present    No results found for this or any previous visit (from the past 24 hour(s)).  Assessment & Plan:  1) Low-risk pregnancy G1P0 at [redacted]w[redacted]d with an Estimated Date of Delivery: 05/08/21     Meds: No orders of the defined types were placed in this encounter.  Labs/procedures today: 2nd IT  Plan:  Continue routine obstetrical care   Reviewed: Preterm labor symptoms and general obstetric precautions including but not limited to vaginal bleeding,  contractions, leaking of fluid and fetal movement were reviewed in detail with the patient.  All questions were answered. Has home bp cuff. Check bp weekly, let us know if >140/90.   Follow-up: Return in about 4 weeks (around 12/18/2020) for LROB, [place]: Anatomy.  Orders Placed This Encounter  Procedures  . US OB Comp + 14 Wk  . INTEGRATED 2   Arabella [facility] 11/20/2020 10:44 AM

## 2020-11-22 LAB — INTEGRATED 2
AFP MoM: 1.34
Alpha-Fetoprotein: 38 ng/mL
Crown Rump Length: 55.7 mm
DIA MoM: 1.58
DIA Value: 216.9 pg/mL
Estriol, Unconjugated: 0.89 ng/mL
Gest. Age on Collection Date: 12 weeks
Gestational Age: 16 weeks
Maternal Age at EDD: 20 yr
Nuchal Translucency (NT): 1.2 mm
Nuchal Translucency MoM: 0.97
Number of Fetuses: 1
PAPP-A MoM: 0.65
PAPP-A Value: 416.1 ng/mL
Test Results:: NEGATIVE
Weight: 184 [lb_av]
Weight: 184 [lb_av]
hCG MoM: 0.73
hCG Value: 23.6 IU/mL
uE3 MoM: 1.04

## 2020-12-23 ENCOUNTER — Ambulatory Visit (INDEPENDENT_AMBULATORY_CARE_PROVIDER_SITE_OTHER): Payer: Medicaid Other

## 2020-12-23 ENCOUNTER — Other Ambulatory Visit: Payer: Self-pay

## 2020-12-23 ENCOUNTER — Ambulatory Visit (INDEPENDENT_AMBULATORY_CARE_PROVIDER_SITE_OTHER): Payer: Medicaid Other | Admitting: Women's Health

## 2020-12-23 ENCOUNTER — Encounter: Payer: Self-pay | Admitting: Women's Health

## 2020-12-23 VITALS — BP 109/65 | HR 73 | Wt 188.5 lb

## 2020-12-23 DIAGNOSIS — Z363 Encounter for antenatal screening for malformations: Secondary | ICD-10-CM | POA: Diagnosis not present

## 2020-12-23 DIAGNOSIS — Z3A20 20 weeks gestation of pregnancy: Secondary | ICD-10-CM

## 2020-12-23 DIAGNOSIS — Z3402 Encounter for supervision of normal first pregnancy, second trimester: Secondary | ICD-10-CM

## 2020-12-23 NOTE — Progress Notes (Signed)
[place] 20+4 wks,cephalic,cx length 3 cm,normal ovaries,posterior placenta gr 0,SVP of fluid 4.5 cm,FHR 150 bpm,EFW 371 g 51%,anatomy complete,no obvious abnormalities

## 2020-12-23 NOTE — Patient Instructions (Signed)
Delainie, thank you for choosing our office today! We appreciate the opportunity to meet your healthcare needs. You may receive a short survey by mail, e-mail, or through MyChart. If you are happy with your care we would appreciate if you could take just a few minutes to complete the survey questions. We read all of your comments and take your feedback very seriously. Thank you again for choosing our office.  [facility] Team at [facility] [place] ([address] [place], [place] [address]) Entrance C, located off of [address] Free 24/7 valet parking  Go to [email or web address] to register for FREE online childbirth classes  Call the office ([phone]) or go to [facility] if: You begin to severe cramping Your water breaks.  Sometimes it is a big gush of fluid, sometimes it is just a trickle that keeps getting your panties wet or running down your legs You have vaginal bleeding.  It is normal to have a small amount of spotting if your cervix was checked.   [place] Pediatricians/Family Doctors [facility] ([facility]): [address], [phone]           [facility]: [address], [phone]                 [facility] ([facility]): [address], [phone] (call to ask if accepting patients) [facility]: [address], [place], [phone]    [place] Pediatricians/Family Doctors [facility] ([facility]): [address], [phone] [facility]: [address], [phone] [facility]: [address], [phone]  [place] [facility]  [facility] ([facility]): [phone] [facility]: [address], [phone]   [place] Family Doctors [facility]: [address], [phone]  [place] [facility]  [facility]: [address], [phone]  Home Blood Pressure Monitoring for Patients   Your provider has recommended that you check your blood pressure (BP) at least once a week at home. If you do not have a blood pressure cuff at home, one will be provided for you. Contact your provider if you have not received your monitor within 1 week.   Helpful Tips for Accurate Home Blood Pressure Checks  Don't smoke, exercise, or drink caffeine 30 minutes before checking your BP Use the restroom before checking your BP (a full bladder can raise your pressure) Relax in a comfortable upright chair Feet on the ground Left arm resting comfortably on a flat surface at the level of your heart Legs uncrossed Back supported Sit quietly and don't talk Place the cuff on your bare arm Adjust snuggly, so that only two fingertips can fit between your skin and the top of the cuff Check 2 readings separated by at least one minute Keep a log of your BP readings For a visual, please reference this diagram: http://[facility].care/bpdiagram  Provider Name: [facility]     Phone: [phone]  Zone 1: ALL CLEAR  Continue to monitor your symptoms:  BP reading is less than 140 (top number) or less than 90 (bottom number)  No right upper stomach pain No headaches or seeing spots No feeling nauseated or throwing up No swelling in face and hands  Zone 2: CAUTION Call your doctor's office for any of the following:  BP reading is greater than 140 (top number) or greater than   90 (bottom number)  Stomach pain under your ribs in the middle or right side Headaches or seeing spots Feeling nauseated or throwing up Swelling in face and hands  Zone 3: EMERGENCY  Seek immediate medical care if you have any of the following:  BP reading is greater than160 (top number) or greater than 110 (bottom number) Severe headaches not improving with Tylenol Serious difficulty catching your breath Any worsening symptoms from  Zone 2     Second Trimester of Pregnancy The second trimester is from week 14 through week 27 (months 4 through 6). The second trimester is often a time when you feel your best. Your body has adjusted to being pregnant, and you begin to feel better physically. Usually, morning sickness has lessened or quit completely, you may have more energy, and you may have an increase in appetite. The second trimester is also a time when the fetus is growing rapidly. At the end of the sixth month, the fetus is about 9 inches long and weighs about 1 pounds. You will likely begin to feel the baby move (quickening) between 16 and 20 weeks of pregnancy. Body changes during your second trimester Your body continues to go through many changes during your second trimester. The changes vary from woman to woman. Your weight will continue to increase. You will notice your lower abdomen bulging out. You may begin to get stretch marks on your hips, abdomen, and breasts. You may develop headaches that can be relieved by medicines. The medicines should be approved by your health care provider. You may urinate more often because the fetus is pressing on your bladder. You may develop or continue to have heartburn as a result of your pregnancy. You may develop constipation because certain hormones are causing the muscles that push waste through your intestines to slow down. You may develop hemorrhoids or swollen, bulging veins (varicose veins). You may have back pain. This is caused by: Weight gain. Pregnancy hormones that are relaxing the joints in your pelvis. A shift in weight and the muscles that support your balance. Your breasts will continue to grow and they will continue to become tender. Your gums may bleed and may be sensitive to brushing and flossing. Dark spots or blotches (chloasma, mask of pregnancy) may develop on your face. This will likely fade after the baby is born. A dark line from your belly button to  the pubic area (linea nigra) may appear. This will likely fade after the baby is born. You may have changes in your hair. These can include thickening of your hair, rapid growth, and changes in texture. Some women also have hair loss during or after pregnancy, or hair that feels dry or thin. Your hair will most likely return to normal after your baby is born.  What to expect at prenatal visits During a routine prenatal visit: You will be weighed to make sure you and the fetus are growing normally. Your blood pressure will be taken. Your abdomen will be measured to track your baby's growth. The fetal heartbeat will be listened to. Any test results from the previous visit will be discussed.  Your health care provider may ask you: How you are feeling. If you are feeling the baby move. If you have had any abnormal symptoms, such as leaking fluid, bleeding, severe headaches, or abdominal cramping. If you are using any tobacco products, including cigarettes, chewing tobacco, and electronic cigarettes. If you have any questions.  Other tests that may be performed during   your second trimester include: Blood tests that check for: Low iron levels (anemia). High blood sugar that affects pregnant women (gestational diabetes) between 24 and 28 weeks. Rh antibodies. This is to check for a protein on red blood cells (Rh factor). Urine tests to check for infections, diabetes, or protein in the urine. An ultrasound to confirm the proper growth and development of the baby. An amniocentesis to check for possible genetic problems. Fetal screens for spina bifida and Down syndrome. HIV (human immunodeficiency virus) testing. Routine prenatal testing includes screening for HIV, unless you choose not to have this test.  Follow these instructions at home: Medicines Follow your health care provider's instructions regarding medicine use. Specific medicines may be either safe or unsafe to take during  pregnancy. Take a prenatal vitamin that contains at least 600 micrograms (mcg) of folic acid. If you develop constipation, try taking a stool softener if your health care provider approves. Eating and drinking Eat a balanced diet that includes fresh fruits and vegetables, whole grains, good sources of protein such as meat, eggs, or tofu, and low-fat dairy. Your health care provider will help you determine the amount of weight gain that is right for you. Avoid raw meat and uncooked cheese. These carry germs that can cause birth defects in the baby. If you have low calcium intake from food, talk to your health care provider about whether you should take a daily calcium supplement. Limit foods that are high in fat and processed sugars, such as fried and sweet foods. To prevent constipation: Drink enough fluid to keep your urine clear or pale yellow. Eat foods that are high in fiber, such as fresh fruits and vegetables, whole grains, and beans. Activity Exercise only as directed by your health care provider. Most women can continue their usual exercise routine during pregnancy. Try to exercise for 30 minutes at least 5 days a week. Stop exercising if you experience uterine contractions. Avoid heavy lifting, wear low heel shoes, and practice good posture. A sexual relationship may be continued unless your health care provider directs you otherwise. Relieving pain and discomfort Wear a good support bra to prevent discomfort from breast tenderness. Take warm sitz baths to soothe any pain or discomfort caused by hemorrhoids. Use hemorrhoid cream if your health care provider approves. Rest with your legs elevated if you have leg cramps or low back pain. If you develop varicose veins, wear support hose. Elevate your feet for 15 minutes, 3-4 times a day. Limit salt in your diet. Prenatal Care Write down your questions. Take them to your prenatal visits. Keep all your prenatal visits as told by your health  care provider. This is important. Safety Wear your seat belt at all times when driving. Make a list of emergency phone numbers, including numbers for family, friends, the hospital, and police and fire departments. General instructions Ask your health care provider for a referral to a local prenatal education class. Begin classes no later than the beginning of month 6 of your pregnancy. Ask for help if you have counseling or nutritional needs during pregnancy. Your health care provider can offer advice or refer you to specialists for help with various needs. Do not use hot tubs, steam rooms, or saunas. Do not douche or use tampons or scented sanitary pads. Do not cross your legs for long periods of time. Avoid cat litter boxes and soil used by cats. These carry germs that can cause birth defects in the baby and possibly loss of the   fetus by miscarriage or stillbirth. Avoid all smoking, herbs, alcohol, and unprescribed drugs. Chemicals in these products can affect the formation and growth of the baby. Do not use any products that contain nicotine or tobacco, such as cigarettes and e-cigarettes. If you need help quitting, ask your health care provider. Visit your dentist if you have not gone yet during your pregnancy. Use a soft toothbrush to brush your teeth and be gentle when you floss. Contact a health care provider if: You have dizziness. You have mild pelvic cramps, pelvic pressure, or nagging pain in the abdominal area. You have persistent nausea, vomiting, or diarrhea. You have a bad smelling vaginal discharge. You have pain when you urinate. Get help right away if: You have a fever. You are leaking fluid from your vagina. You have spotting or bleeding from your vagina. You have severe abdominal cramping or pain. You have rapid weight gain or weight loss. You have shortness of breath with chest pain. You notice sudden or extreme swelling of your face, hands, ankles, feet, or legs. You  have not felt your baby move in over an hour. You have severe headaches that do not go away when you take medicine. You have vision changes. Summary The second trimester is from week 14 through week 27 (months 4 through 6). It is also a time when the fetus is growing rapidly. Your body goes through many changes during pregnancy. The changes vary from woman to woman. Avoid all smoking, herbs, alcohol, and unprescribed drugs. These chemicals affect the formation and growth your baby. Do not use any tobacco products, such as cigarettes, chewing tobacco, and e-cigarettes. If you need help quitting, ask your health care provider. Contact your health care provider if you have any questions. Keep all prenatal visits as told by your health care provider. This is important. This information is not intended to replace advice given to you by your health care provider. Make sure you discuss any questions you have with your health care provider. Document Released: 06/16/2001 Document Revised: 11/28/2015 Document Reviewed: 08/23/2012 Elsevier Interactive Patient Education  2017 [employer] Inc.  

## 2020-12-23 NOTE — Progress Notes (Signed)
LOW-RISK PREGNANCY VISIT Patient name: Dawn Blankenship MRN [id number]  Date of birth: 26-Sep-2000 Chief Complaint:   Routine Prenatal Visit ([place] today)  History of Present Illness:   Dawn Blankenship is a 20 y.o. G1P0 female at [redacted]w[redacted]d with an Estimated Date of Delivery: 05/08/21 being seen today for ongoing management of a low-risk pregnancy.   Today she reports no complaints. Interested in Systems developer. Contractions: Not present. Vag. Bleeding: None.  Movement: Present. denies leaking of fluid.  Depression screen [facility] 2/9 10/23/2020 09/13/2020  Decreased Interest 0 1  Down, Depressed, Hopeless 1 0  PHQ - 2 Score 1 1  Altered sleeping 1 1  Tired, decreased energy 1 1  Change in appetite 0 0  Feeling bad or failure about yourself  1 0  Trouble concentrating 1 1  Moving slowly or fidgety/restless 0 0  Suicidal thoughts 0 0  PHQ-9 Score 5 4     GAD 7 : Generalized Anxiety Score 10/23/2020 09/13/2020  Nervous, Anxious, on Edge 0 1  Control/stop worrying 1 1  Worry too much - different things 1 1  Trouble relaxing 1 0  Restless 0 1  Easily annoyed or irritable 1 1  Afraid - awful might happen 0 0  Total GAD 7 Score 4 5      Review of Systems:   Pertinent items are noted in HPI Denies abnormal vaginal discharge w/ itching/odor/irritation, headaches, visual changes, shortness of breath, chest pain, abdominal pain, severe nausea/vomiting, or problems with urination or bowel movements unless otherwise stated above. Pertinent History Reviewed:  Reviewed past medical,surgical, social, obstetrical and family history.  Reviewed problem list, medications and allergies. Physical Assessment:   Vitals:   12/23/20 1143  BP: 109/65  Pulse: 73  Weight: 188 lb 8 oz (85.5 kg)  Body mass index is 32.36 kg/m.        Physical Examination:   General appearance: Well appearing, and in no distress  Mental status: Alert, oriented to person, place, and time  Skin: Warm &  dry  Cardiovascular: Normal heart rate noted  Respiratory: Normal respiratory effort, no distress  Abdomen: Soft, gravid, nontender  Pelvic: Cervical exam deferred         Extremities: Edema: Trace  Fetal Status: Fetal Heart Rate (bpm): 150 u/s   Movement: Present   [place] 20+4 wks,cephalic,cx length 3 cm,normal ovaries,posterior placenta gr 0,SVP of fluid 4.5 cm,FHR 150 bpm,EFW 371 g 51%,anatomy complete,no obvious abnormalities  Chaperone: N/A   No results found for this or any previous visit (from the past 24 hour(s)).  Assessment & Plan:  1) Low-risk pregnancy G1P0 at [redacted]w[redacted]d with an Estimated Date of Delivery: 05/08/21   2) Interested in waterbirth, sign up for class   Meds: No orders of the defined types were placed in this encounter.  Labs/procedures today: U/S  Plan:  Continue routine obstetrical care  Next visit: prefers in person    Reviewed: Preterm labor symptoms and general obstetric precautions including but not limited to vaginal bleeding, contractions, leaking of fluid and fetal movement were reviewed in detail with the patient.  All questions were answered. Does have home bp cuff. Office bp cuff given: not applicable. Check bp weekly, let us know if consistently >140 and/or >90.  Follow-up: Return in about 4 weeks (around 01/20/2021) for LROB, CNM, in person.  Future Appointments  Date Time Provider Department Center  01/20/2021  9:50 AM Cheral Marker, CNM [facility]    No orders of the  defined types were placed in this encounter.  Cheral Marker CNM, [facility] 12/23/2020 12:27 PM

## 2021-01-20 ENCOUNTER — Ambulatory Visit (INDEPENDENT_AMBULATORY_CARE_PROVIDER_SITE_OTHER): Payer: Medicaid Other | Admitting: Women's Health

## 2021-01-20 ENCOUNTER — Encounter: Payer: Self-pay | Admitting: Women's Health

## 2021-01-20 ENCOUNTER — Other Ambulatory Visit: Payer: Self-pay

## 2021-01-20 VITALS — BP 118/67 | HR 90 | Wt 192.0 lb

## 2021-01-20 DIAGNOSIS — Z3402 Encounter for supervision of normal first pregnancy, second trimester: Secondary | ICD-10-CM

## 2021-01-20 DIAGNOSIS — Z3A24 24 weeks gestation of pregnancy: Secondary | ICD-10-CM

## 2021-01-20 NOTE — Progress Notes (Signed)
    LOW-RISK PREGNANCY VISIT Patient name: Dawn Blankenship MRN [id number]  Date of birth: Apr 18, 2001 Chief Complaint:   Routine Prenatal Visit  History of Present Illness:   Dawn Blankenship is a 20 y.o. G1P0 female at [redacted]w[redacted]d with an Estimated Date of Delivery: 05/08/21 being seen today for ongoing management of a low-risk pregnancy.   Today she reports no complaints. Contractions: Not present. Vag. Bleeding: None.  Movement: Present. denies leaking of fluid.  Depression screen [facility] 2/9 10/23/2020 09/13/2020  Decreased Interest 0 1  Down, Depressed, Hopeless 1 0  PHQ - 2 Score 1 1  Altered sleeping 1 1  Tired, decreased energy 1 1  Change in appetite 0 0  Feeling bad or failure about yourself  1 0  Trouble concentrating 1 1  Moving slowly or fidgety/restless 0 0  Suicidal thoughts 0 0  PHQ-9 Score 5 4     GAD 7 : Generalized Anxiety Score 10/23/2020 09/13/2020  Nervous, Anxious, on Edge 0 1  Control/stop worrying 1 1  Worry too much - different things 1 1  Trouble relaxing 1 0  Restless 0 1  Easily annoyed or irritable 1 1  Afraid - awful might happen 0 0  Total GAD 7 Score 4 5      Review of Systems:   Pertinent items are noted in HPI Denies abnormal vaginal discharge w/ itching/odor/irritation, headaches, visual changes, shortness of breath, chest pain, abdominal pain, severe nausea/vomiting, or problems with urination or bowel movements unless otherwise stated above. Pertinent History Reviewed:  Reviewed past medical,surgical, social, obstetrical and family history.  Reviewed problem list, medications and allergies. Physical Assessment:   Vitals:   01/20/21 1002  BP: 118/67  Pulse: 90  Weight: 192 lb (87.1 kg)  Body mass index is 32.96 kg/m.        Physical Examination:   General appearance: Well appearing, and in no distress  Mental status: Alert, oriented to person, place, and time  Skin: Warm & dry  Cardiovascular: Normal heart rate  noted  Respiratory: Normal respiratory effort, no distress  Abdomen: Soft, gravid, nontender  Pelvic: Cervical exam deferred         Extremities: Edema: Trace  Fetal Status: Fetal Heart Rate (bpm): 160 Fundal Height: 24 cm Movement: Present    Chaperone: N/A   No results found for this or any previous visit (from the past 24 hour(s)).  Assessment & Plan:  1) Low-risk pregnancy G1P0 at [redacted]w[redacted]d with an Estimated Date of Delivery: 05/08/21   2) Interested in waterbirth, sign up for class   Meds: No orders of the defined types were placed in this encounter.  Labs/procedures today: none  Plan:  Continue routine obstetrical care  Next visit: prefers will be in person for pn2     Reviewed: Preterm labor symptoms and general obstetric precautions including but not limited to vaginal bleeding, contractions, leaking of fluid and fetal movement were reviewed in detail with the patient.  All questions were answered. Does have home bp cuff. Office bp cuff given: not applicable. Check bp weekly, let us know if consistently >140 and/or >90.  Follow-up: Return in about 4 weeks (around 02/17/2021) for LROB, PN2, CNM, in person.  No future appointments.  No orders of the defined types were placed in this encounter.  Cheral Marker CNM, [facility] 01/20/2021 10:17 AM

## 2021-01-20 NOTE — Patient Instructions (Addendum)
Reisha, thank you for choosing our office today! We appreciate the opportunity to meet your healthcare needs. You may receive a short survey by mail, e-mail, or through [employer]. If you are happy with your care we would appreciate if you could take just a few minutes to complete the survey questions. We read all of your comments and take your feedback very seriously. Thank you again for choosing our office.  [facility] [employer] Team at [facility]  [facility] at [facility] ([address] [place], [place] [address]) Entrance C, located off of [address] Free 24/7 valet parking   You will have your sugar test next visit.  Please do not eat or drink anything after midnight the night before you come, not even water.  You will be here for at least two hours.  Please make an appointment online for the bloodwork at [email or web address] for 8:00am (or as close to this as possible). Make sure you select the [facility] service center.   CLASSES: Go to [email or web address] to register for classes (childbirth, breastfeeding, waterbirth, infant CPR, daddy bootcamp, etc.)  Call the office [phone]) or go to [facility] if: You begin to have strong, frequent contractions Your water breaks.  Sometimes it is a big gush of fluid, sometimes it is just a trickle that keeps getting your panties wet or running down your legs You have vaginal bleeding.  It is normal to have a small amount of spotting if your cervix was checked.  You don't feel your baby moving like normal.  If you don't, get you something to eat and drink and lay down and focus on feeling your baby move.   If your baby is still not moving like normal, you should call the office or go to [facility].  Call the office [phone]) or go to [facility] hospital for these signs of pre-eclampsia: Severe headache that does not go away with Tylenol Visual changes- seeing spots, double, blurred vision Pain under your right breast or upper  abdomen that does not go away with Tums or heartburn medicine Nausea and/or vomiting Severe swelling in your hands, feet, and face    [facility] Pediatricians/Family Doctors [facility]): [address] Colette Ribas, [phone]           [facility]: [address], [phone]                [facility]): [address], [phone] (call to ask if accepting patients) [facility]: [address] [place], [phone]    [facility] Pediatricians/Family Doctors [facility]): [address], [phone] [facility]: [address] [place], [phone] [facility]: [address], [phone]  [facility] Doctors  [facility]): [phone] [facility]: [address], [phone]   [facility] Doctors [facility]: [address], [phone]  [facility] Doctors  [facility]: [phone], [phone]  Home Blood Pressure Monitoring for Patients   Your provider has recommended that you check your blood pressure (BP) at least once a week at home. If you do not have a blood pressure cuff at home, one will be provided for you. Contact your provider if you have not received your monitor within 1 week.   Helpful Tips for Accurate Home Blood Pressure Checks  Don't smoke, exercise, or drink  caffeine 30 minutes before checking your BP Use the restroom before checking your BP (a full bladder can raise your pressure) Relax in a comfortable upright chair Feet on the ground Left arm resting comfortably on a flat surface at the level of your heart Legs uncrossed Back supported Sit quietly and don't talk Place the cuff on your bare arm Adjust snuggly, so that only two fingertips can fit between your skin and the top of the cuff Check 2  readings separated by at least one minute Keep a log of your BP readings For a visual, please reference this diagram: http://ccnc.care/bpdiagram  Provider Name: [facility]     Phone: [phone]  Zone 1: ALL CLEAR  Continue to monitor your symptoms:  BP reading is less than 140 (top number) or less than 90 (bottom number)  No right upper stomach pain No headaches or seeing spots No feeling nauseated or throwing up No swelling in face and hands  Zone 2: CAUTION Call your doctor's office for any of the following:  BP reading is greater than 140 (top number) or greater than 90 (bottom number)  Stomach pain under your ribs in the middle or right side Headaches or seeing spots Feeling nauseated or throwing up Swelling in face and hands  Zone 3: EMERGENCY  Seek immediate medical care if you have any of the following:  BP reading is greater than160 (top number) or greater than 110 (bottom number) Severe headaches not improving with Tylenol Serious difficulty catching your breath Any worsening symptoms from Zone 2   Second Trimester of Pregnancy The second trimester is from week 13 through week 28, months 4 through 6. The second trimester is often a time when you feel your best. Your body has also adjusted to being pregnant, and you begin to feel better physically. Usually, morning sickness has lessened or quit completely, you may have more energy, and you may have an increase in appetite. The second trimester is also a time when the fetus is growing rapidly. At the end of the sixth month, the fetus is about 9 inches long and weighs about 1 pounds. You will likely begin to feel the baby move (quickening) between 18 and 20 weeks of the pregnancy. BODY CHANGES Your body goes through many changes during pregnancy. The changes vary from woman to woman.  Your weight will continue to increase. You will notice your lower abdomen bulging out. You may begin to get stretch marks on your  hips, abdomen, and breasts. You may develop headaches that can be relieved by medicines approved by your health care provider. You may urinate more often because the fetus is pressing on your bladder. You may develop or continue to have heartburn as a result of your pregnancy. You may develop constipation because certain hormones are causing the muscles that push waste through your intestines to slow down. You may develop hemorrhoids or swollen, bulging veins (varicose veins). You may have back pain because of the weight gain and pregnancy hormones relaxing your joints between the bones in your pelvis and as a result of a shift in weight and the muscles that support your balance. Your breasts will continue to grow and be tender. Your gums may bleed and may be sensitive to brushing and flossing. Dark spots or blotches (chloasma, mask of pregnancy) may develop on your face. This will likely fade after the baby is born. A dark line from your belly button to the pubic area (linea nigra) may appear. This  will likely fade after the baby is born. You may have changes in your hair. These can include thickening of your hair, rapid growth, and changes in texture. Some women also have hair loss during or after pregnancy, or hair that feels dry or thin. Your hair will most likely return to normal after your baby is born. WHAT TO EXPECT AT YOUR PRENATAL VISITS During a routine prenatal visit: You will be weighed to make sure you and the fetus are growing normally. Your blood pressure will be taken. Your abdomen will be measured to track your baby's growth. The fetal heartbeat will be listened to. Any test results from the previous visit will be discussed. Your health care provider may ask you: How you are feeling. If you are feeling the baby move. If you have had any abnormal symptoms, such as leaking fluid, bleeding, severe headaches, or abdominal cramping. If you have any questions. Other tests that may  be performed during your second trimester include: Blood tests that check for: Low iron levels (anemia). Gestational diabetes (between 24 and 28 weeks). Rh antibodies. Urine tests to check for infections, diabetes, or protein in the urine. An ultrasound to confirm the proper growth and development of the baby. An amniocentesis to check for possible genetic problems. Fetal screens for spina bifida and Down syndrome. HOME CARE INSTRUCTIONS  Avoid all smoking, herbs, alcohol, and unprescribed drugs. These chemicals affect the formation and growth of the baby. Follow your health care provider's instructions regarding medicine use. There are medicines that are either safe or unsafe to take during pregnancy. Exercise only as directed by your health care provider. Experiencing uterine cramps is a good sign to stop exercising. Continue to eat regular, healthy meals. Wear a good support bra for breast tenderness. Do not use hot tubs, steam rooms, or saunas. Wear your seat belt at all times when driving. Avoid raw meat, uncooked cheese, cat litter boxes, and soil used by cats. These carry germs that can cause birth defects in the baby. Take your prenatal vitamins. Try taking a stool softener (if your health care provider approves) if you develop constipation. Eat more high-fiber foods, such as fresh vegetables or fruit and whole grains. Drink plenty of fluids to keep your urine clear or pale yellow. Take warm sitz baths to soothe any pain or discomfort caused by hemorrhoids. Use hemorrhoid cream if your health care provider approves. If you develop varicose veins, wear support hose. Elevate your feet for 15 minutes, 3-4 times a day. Limit salt in your diet. Avoid heavy lifting, wear low heel shoes, and practice good posture. Rest with your legs elevated if you have leg cramps or low back pain. Visit your dentist if you have not gone yet during your pregnancy. Use a soft toothbrush to brush your teeth  and be gentle when you floss. A sexual relationship may be continued unless your health care provider directs you otherwise. Continue to go to all your prenatal visits as directed by your health care provider. SEEK MEDICAL CARE IF:  You have dizziness. You have mild pelvic cramps, pelvic pressure, or nagging pain in the abdominal area. You have persistent nausea, vomiting, or diarrhea. You have a bad smelling vaginal discharge. You have pain with urination. SEEK IMMEDIATE MEDICAL CARE IF:  You have a fever. You are leaking fluid from your vagina. You have spotting or bleeding from your vagina. You have severe abdominal cramping or pain. You have rapid weight gain or loss. You have shortness of   breath with chest pain. You notice sudden or extreme swelling of your face, hands, ankles, feet, or legs. You have not felt your baby move in over an hour. You have severe headaches that do not go away with medicine. You have vision changes. Document Released: 06/16/2001 Document Revised: 06/27/2013 Document Reviewed: 08/23/2012 [facility] Patient Information 2015 [place], [place]. This information is not intended to replace advice given to you by your health care provider. Make sure you discuss any questions you have with your health care provider.   Considering Waterbirth? Guide for patients at [facility] for [employer] [facility]) Why consider waterbirth? Gentle birth for babies  Less pain medicine used in labor  May allow for passive descent/less pushing  May reduce perineal tears  More mobility and instinctive maternal position changes  Increased maternal relaxation   Is waterbirth safe? What are the risks of infection, drowning or other complications? Infection:  Very low risk (3.7 % for tub vs 4.8% for bed)  7 in 8000 waterbirths with documented infection  Poorly cleaned equipment most common cause  Slightly lower group B strep transmission rate  Drowning  Maternal:  Very low risk   Related to seizures or fainting  Newborn:  Very low risk. No evidence of increased risk of respiratory problems in multiple large studies  Physiological protection from breathing under water  Avoid underwater birth if there are any fetal complications  Once baby's head is out of the water, keep it out.  Birth complication  Some reports of cord trauma, but risk decreased by bringing baby to surface gradually  No evidence of increased risk of shoulder dystocia. Mothers can usually change positions faster in water than in a bed, possibly aiding the maneuvers to free the shoulder.   There are 2 things you MUST do to have a waterbirth with [facility]: Attend a waterbirth class at [employer] [facility]   3rd Wednesday of every month from 7-9 pm (virtual during COVID) [employer] at [email or web address] or [email or web address] or by calling [phone] Bring [place] the certificate from the class to your prenatal appointment or send via MyChart Meet with a midwife at 36 weeks* to see if you can still plan a waterbirth and to sign the consent.   *We also recommend that you schedule as many of your prenatal visits with a midwife as possible.    Helpful information: You may want to bring a bathing suit top to the hospital to wear during labor but this is optional.  All other supplies are provided by the hospital. Please arrive at the hospital with signs of active labor, and do not wait at home until late in labor. It takes 45 min- 2 hours for COVID testing, fetal monitoring, and check in to your room to take place, plus transport and filling of the waterbirth tub.    Things that would prevent you from having a waterbirth: Unknown or Positive COVID-19 diagnosis upon admission to hospital* Premature, <37wks  Previous cesarean birth  Presence of thick meconium-stained fluid  Multiple gestation (Twins, triplets, etc.)  Uncontrolled diabetes or gestational diabetes  requiring medication  Hypertension diagnosed in pregnancy or preexisting hypertension (gestational hypertension, preeclampsia, or chronic hypertension) Fetal growth restriction (your baby measures less than 10th percentile on ultrasound) Heavy vaginal bleeding  Non-reassuring fetal heart rate  Active infection (MRSA, etc.). Group B Strep is NOT a contraindication for waterbirth.  If your labor has to be induced and induction method requires continuous monitoring of the baby's  heart rate  Other risks/issues identified by your obstetrical provider   Please remember that birth is unpredictable. Under certain unforeseeable circumstances your provider may advise against giving birth in the tub. These decisions will be made on a case-by-case basis and with the safety of you and your baby as our highest priority.   *Please remember that in order to have a waterbirth, you must test Negative to COVID-19 upon admission to the hospital.  Updated 10/14/20

## 2021-02-18 ENCOUNTER — Encounter: Payer: Medicaid Other | Admitting: Women's Health

## 2021-02-18 ENCOUNTER — Other Ambulatory Visit: Payer: Medicaid Other

## 2021-02-24 ENCOUNTER — Other Ambulatory Visit: Payer: Self-pay

## 2021-02-24 ENCOUNTER — Encounter: Payer: Self-pay | Admitting: Obstetrics & Gynecology

## 2021-02-24 ENCOUNTER — Other Ambulatory Visit: Payer: Medicaid Other

## 2021-02-24 ENCOUNTER — Ambulatory Visit (INDEPENDENT_AMBULATORY_CARE_PROVIDER_SITE_OTHER): Payer: Medicaid Other | Admitting: Obstetrics & Gynecology

## 2021-02-24 VITALS — BP 119/72 | HR 92 | Wt 196.0 lb

## 2021-02-24 DIAGNOSIS — Z3A29 29 weeks gestation of pregnancy: Secondary | ICD-10-CM

## 2021-02-24 DIAGNOSIS — Z3403 Encounter for supervision of normal first pregnancy, third trimester: Secondary | ICD-10-CM

## 2021-02-24 NOTE — Progress Notes (Signed)
   LOW-RISK PREGNANCY VISIT Patient name: Dawn Blankenship MRN [id number]  Date of birth: 2000-12-21 Chief Complaint:   Routine Prenatal Visit (PN2 today)  History of Present Illness:   Dawn Blankenship is a 20 y.o. G1P0 female at [redacted]w[redacted]d with an Estimated Date of Delivery: 05/08/21 being seen today for ongoing management of a low-risk pregnancy.  Depression screen [facility] 2/9 02/24/2021 10/23/2020 09/13/2020  Decreased Interest 0 0 1  Down, Depressed, Hopeless 0 1 0  PHQ - 2 Score 0 1 1  Altered sleeping 1 1 1   Tired, decreased energy 0 1 1  Change in appetite 0 0 0  Feeling bad or failure about yourself  0 1 0  Trouble concentrating 0 1 1  Moving slowly or fidgety/restless 0 0 0  Suicidal thoughts 0 0 0  PHQ-9 Score 1 5 4     Today she reports no complaints. Contractions: Not present. Vag. Bleeding: None.  Movement: Present. denies leaking of fluid. Review of Systems:   Pertinent items are noted in HPI Denies abnormal vaginal discharge w/ itching/odor/irritation, headaches, visual changes, shortness of breath, chest pain, abdominal pain, severe nausea/vomiting, or problems with urination or bowel movements unless otherwise stated above. Pertinent History Reviewed:  Reviewed past medical,surgical, social, obstetrical and family history.  Reviewed problem list, medications and allergies. Physical Assessment:   Vitals:   02/24/21 0910  BP: 119/72  Pulse: 92  Weight: 196 lb (88.9 kg)  Body mass index is 33.64 kg/m.        Physical Examination:   General appearance: Well appearing, and in no distress  Mental status: Alert, oriented to person, place, and time  Skin: Warm & dry  Cardiovascular: Normal heart rate noted  Respiratory: Normal respiratory effort, no distress  Abdomen: Soft, gravid, nontender  Pelvic: Cervical exam deferred         Extremities: Edema: Trace  Fetal Status: Fetal Heart Rate (bpm): 155 Fundal Height: 29 cm Movement: Present    Chaperone: n/a    No  results found for this or any previous visit (from the past 24 hour(s)).  Assessment & Plan:  1) Low-risk pregnancy G1P0 at [redacted]w[redacted]d with an Estimated Date of Delivery: 05/08/21   2) ,    Meds: No orders of the defined types were placed in this encounter.  Labs/procedures today: PN2  Plan:  Continue routine obstetrical care  Next visit: prefers in person    Reviewed: Preterm labor symptoms and general obstetric precautions including but not limited to vaginal bleeding, contractions, leaking of fluid and fetal movement were reviewed in detail with the patient.  All questions were answered. Has home bp cuff. Rx faxed to . Check bp weekly, let [redacted]w[redacted]d know if >140/90.   Follow-up: Return in about 3 weeks (around 03/17/2021) for LROB.  No orders of the defined types were placed in this encounter.   [place], MD 02/24/2021 9:57 AM

## 2021-02-25 LAB — RPR: RPR Ser Ql: NONREACTIVE

## 2021-02-25 LAB — CBC
Hematocrit: 33.6 % — ABNORMAL LOW (ref 34.0–46.6)
Hemoglobin: 11.3 g/dL (ref 11.1–15.9)
MCH: 28.6 pg (ref 26.6–33.0)
MCHC: 33.6 g/dL (ref 31.5–35.7)
MCV: 85 fL (ref 79–97)
Platelets: 292 10*3/uL (ref 150–450)
RBC: 3.95 x10E6/uL (ref 3.77–5.28)
RDW: 12.6 % (ref 11.7–15.4)
WBC: 13 10*3/uL — ABNORMAL HIGH (ref 3.4–10.8)

## 2021-02-25 LAB — GLUCOSE TOLERANCE, 2 HOURS W/ 1HR
Glucose, 1 hour: 105 mg/dL (ref 65–179)
Glucose, 2 hour: 121 mg/dL (ref 65–152)
Glucose, Fasting: 86 mg/dL (ref 65–91)

## 2021-02-25 LAB — HIV ANTIBODY (ROUTINE TESTING W REFLEX): HIV Screen 4th Generation wRfx: NONREACTIVE

## 2021-02-25 LAB — ANTIBODY SCREEN: Antibody Screen: NEGATIVE

## 2021-03-19 ENCOUNTER — Other Ambulatory Visit: Payer: Self-pay

## 2021-03-19 ENCOUNTER — Encounter: Payer: Self-pay | Admitting: Women's Health

## 2021-03-19 ENCOUNTER — Ambulatory Visit (INDEPENDENT_AMBULATORY_CARE_PROVIDER_SITE_OTHER): Payer: Medicaid Other | Admitting: Women's Health

## 2021-03-19 VITALS — BP 122/77 | HR 86 | Wt 200.0 lb

## 2021-03-19 DIAGNOSIS — Z3403 Encounter for supervision of normal first pregnancy, third trimester: Secondary | ICD-10-CM

## 2021-03-19 DIAGNOSIS — Z3A32 32 weeks gestation of pregnancy: Secondary | ICD-10-CM | POA: Diagnosis not present

## 2021-03-19 DIAGNOSIS — Z23 Encounter for immunization: Secondary | ICD-10-CM | POA: Diagnosis not present

## 2021-03-19 NOTE — Patient Instructions (Signed)
Jeanine, thank you for choosing our office today! We appreciate the opportunity to meet your healthcare needs. You may receive a short survey by mail, e-mail, or through [employer]. If you are happy with your care we would appreciate if you could take just a few minutes to complete the survey questions. We read all of your comments and take your feedback very seriously. Thank you again for choosing our office.  Center for [employer] Team at [facility]  [facility] at [facility] ([address] [place], [place] [address]) Entrance C, located off of [address] Free 24/7 valet parking   CLASSES: Go to [email or web address] to register for classes (childbirth, breastfeeding, waterbirth, infant CPR, daddy bootcamp, etc.)  Call the office [phone]) or go to [facility] if: You begin to have strong, frequent contractions Your water breaks.  Sometimes it is a big gush of fluid, sometimes it is just a trickle that keeps getting your panties wet or running down your legs You have vaginal bleeding.  It is normal to have a small amount of spotting if your cervix was checked.  You don't feel your baby moving like normal.  If you don't, get you something to eat and drink and lay down and focus on feeling your baby move.   If your baby is still not moving like normal, you should call the office or go to [facility].  Call the office [phone]) or go to [facility] hospital for these signs of pre-eclampsia: Severe headache that does not go away with Tylenol Visual changes- seeing spots, double, blurred vision Pain under your right breast or upper abdomen that does not go away with Tums or heartburn medicine Nausea and/or vomiting Severe swelling in your hands, feet, and face   Tdap Vaccine It is recommended that you get the Tdap vaccine during the third trimester of EACH pregnancy to help protect your baby from getting pertussis (whooping cough) 27-36 weeks is the BEST time to do  this so that you can pass the protection on to your baby. During pregnancy is better than after pregnancy, but if you are unable to get it during pregnancy it will be offered at the hospital.  You can get this vaccine with [place], at the health department, your family doctor, or some local pharmacies Everyone who will be around your baby should also be up-to-date on their vaccines before the baby comes. Adults (who are not pregnant) only need 1 dose of Tdap during adulthood.   [facility] Pediatricians/Family Doctors [facility]): [address] Colette Ribas, [phone]           [facility]: [address], [phone]                [facility]): [address], [phone] (call to ask if accepting patients) [facility] Department: [address], [place], [phone]    [facility] Pediatricians/Family Doctors [facility]): [phone] [address], [phone] [facility]: [address] [place], [phone] [facility] [place]: [address], [phone]  [facility] Doctors  [facility]): [phone] [facility]: [address] [phone]   [facility] Doctors [facility]: [address] [phone]  [facility] Family Doctors  [facility]: [phone], [phone]  Home Blood Pressure Monitoring for Patients   Your provider has recommended that you check your  blood pressure (BP) at least once a week at home. If you do not have a blood pressure cuff at home, one will be provided for you. Contact your provider if you have not received your monitor within 1 week.   Helpful Tips for Accurate Home Blood Pressure Checks  Don't smoke, exercise, or drink caffeine 30 minutes before checking your BP Use the restroom before checking your BP (a full bladder can raise your  pressure) Relax in a comfortable upright chair Feet on the ground Left arm resting comfortably on a flat surface at the level of your heart Legs uncrossed Back supported Sit quietly and don't talk Place the cuff on your bare arm Adjust snuggly, so that only two fingertips can fit between your skin and the top of the cuff Check 2 readings separated by at least one minute Keep a log of your BP readings For a visual, please reference this diagram: http://ccnc.care/bpdiagram  Provider Name: [facility]     Phone: [phone]  Zone 1: ALL CLEAR  Continue to monitor your symptoms:  BP reading is less than 140 (top number) or less than 90 (bottom number)  No right upper stomach pain No headaches or seeing spots No feeling nauseated or throwing up No swelling in face and hands  Zone 2: CAUTION Call your doctor's office for any of the following:  BP reading is greater than 140 (top number) or greater than 90 (bottom number)  Stomach pain under your ribs in the middle or right side Headaches or seeing spots Feeling nauseated or throwing up Swelling in face and hands  Zone 3: EMERGENCY  Seek immediate medical care if you have any of the following:  BP reading is greater than160 (top number) or greater than 110 (bottom number) Severe headaches not improving with Tylenol Serious difficulty catching your breath Any worsening symptoms from Zone 2  Preterm Labor and Birth Information  The normal length of a pregnancy is 39-41 weeks. Preterm labor is when labor starts before 37 completed weeks of pregnancy. What are the risk factors for preterm labor? Preterm labor is more likely to occur in women who: Have certain infections during pregnancy such as a bladder infection, sexually transmitted infection, or infection inside the uterus (chorioamnionitis). Have a shorter-than-normal cervix. Have gone into preterm labor before. Have had surgery on their cervix. Are younger than age 17  or older than age 35. Are African American. Are pregnant with twins or multiple babies (multiple gestation). Take street drugs or smoke while pregnant. Do not gain enough weight while pregnant. Became pregnant shortly after having been pregnant. What are the symptoms of preterm labor? Symptoms of preterm labor include: Cramps similar to those that can happen during a menstrual period. The cramps may happen with diarrhea. Pain in the abdomen or lower back. Regular uterine contractions that may feel like tightening of the abdomen. A feeling of increased pressure in the pelvis. Increased watery or bloody mucus discharge from the vagina. Water breaking (ruptured amniotic sac). Why is it important to recognize signs of preterm labor? It is important to recognize signs of preterm labor because babies who are born prematurely may not be fully developed. This can put them at an increased risk for: Long-term (chronic) heart and lung problems. Difficulty immediately after birth with regulating body systems, including blood sugar, body temperature, heart rate, and breathing rate. Bleeding in the brain. Cerebral palsy. Learning difficulties. Death. These risks are highest for babies who are born before 34 weeks   of pregnancy. How is preterm labor treated? Treatment depends on the length of your pregnancy, your condition, and the health of your baby. It may involve: Having a stitch (suture) placed in your cervix to prevent your cervix from opening too early (cerclage). Taking or being given medicines, such as: Hormone medicines. These may be given early in pregnancy to help support the pregnancy. Medicine to stop contractions. Medicines to help mature the baby's lungs. These may be prescribed if the risk of delivery is high. Medicines to prevent your baby from developing cerebral palsy. If the labor happens before 34 weeks of pregnancy, you may need to stay in the hospital. What should I do if I  think I am in preterm labor? If you think that you are going into preterm labor, call your health care provider right away. How can I prevent preterm labor in future pregnancies? To increase your chance of having a full-term pregnancy: Do not use any tobacco products, such as cigarettes, chewing tobacco, and e-cigarettes. If you need help quitting, ask your health care provider. Do not use street drugs or medicines that have not been prescribed to you during your pregnancy. Talk with your health care provider before taking any herbal supplements, even if you have been taking them regularly. Make sure you gain a healthy amount of weight during your pregnancy. Watch for infection. If you think that you might have an infection, get it checked right away. Make sure to tell your health care provider if you have gone into preterm labor before. This information is not intended to replace advice given to you by your health care provider. Make sure you discuss any questions you have with your health care provider. Document Revised: 10/14/2018 Document Reviewed: 11/13/2015 Elsevier Patient Education  2020 Elsevier Inc.   

## 2021-03-19 NOTE — Progress Notes (Signed)
LOW-RISK PREGNANCY VISIT Patient name: Dawn Blankenship MRN [id number]  Date of birth: 06-16-2001 Chief Complaint:   Routine Prenatal Visit  History of Present Illness:   Dawn Blankenship is a 20 y.o. G1P0 female at [redacted]w[redacted]d with an Estimated Date of Delivery: 05/08/21 being seen today for ongoing management of a low-risk pregnancy.   Today she reports no complaints. Contractions: Not present. Vag. Bleeding: None.  Movement: Present. denies leaking of fluid.  Depression screen [facility] 2/9 02/24/2021 10/23/2020 09/13/2020  Decreased Interest 0 0 1  Down, Depressed, Hopeless 0 1 0  PHQ - 2 Score 0 1 1  Altered sleeping 1 1 1   Tired, decreased energy 0 1 1  Change in appetite 0 0 0  Feeling bad or failure about yourself  0 1 0  Trouble concentrating 0 1 1  Moving slowly or fidgety/restless 0 0 0  Suicidal thoughts 0 0 0  PHQ-9 Score 1 5 4      GAD 7 : Generalized Anxiety Score 02/24/2021 10/23/2020 09/13/2020  Nervous, Anxious, on Edge 1 0 1  Control/stop worrying 0 1 1  Worry too much - different things 0 1 1  Trouble relaxing 1 1 0  Restless 0 0 1  Easily annoyed or irritable 1 1 1   Afraid - awful might happen 0 0 0  Total GAD 7 Score 3 4 5       Review of Systems:   Pertinent items are noted in HPI Denies abnormal vaginal discharge w/ itching/odor/irritation, headaches, visual changes, shortness of breath, chest pain, abdominal pain, severe nausea/vomiting, or problems with urination or bowel movements unless otherwise stated above. Pertinent History Reviewed:  Reviewed past medical,surgical, social, obstetrical and family history.  Reviewed problem list, medications and allergies. Physical Assessment:   Vitals:   03/19/21 1002  BP: 122/77  Pulse: 86  Weight: 200 lb (90.7 kg)  Body mass index is 34.33 kg/m.        Physical Examination:   General appearance: Well appearing, and in no distress  Mental status: Alert, oriented to person, place, and time  Skin: Warm &  dry  Cardiovascular: Normal heart rate noted  Respiratory: Normal respiratory effort, no distress  Abdomen: Soft, gravid, nontender  Pelvic: Cervical exam deferred         Extremities: Edema: None  Fetal Status: Fetal Heart Rate (bpm): 152 Fundal Height: 33 cm Movement: Present    Chaperone: N/A   No results found for this or any previous visit (from the past 24 hour(s)).  Assessment & Plan:  1) Low-risk pregnancy G1P0 at [redacted]w[redacted]d with an Estimated Date of Delivery: 05/08/21   2) Interested in , hasn't signed up for class, do this asap and send 03/21/21 certificate   Meds: No orders of the defined types were placed in this encounter.  Labs/procedures today: declined flu shot  Plan:  Continue routine obstetrical care  Next visit: prefers in person    Reviewed: Preterm labor symptoms and general obstetric precautions including but not limited to vaginal bleeding, contractions, leaking of fluid and fetal movement were reviewed in detail with the patient.  All questions were answered. Does have home bp cuff. Office bp cuff given: not applicable. Check bp weekly, let [redacted]w[redacted]d know if consistently >140 and/or >90.  Follow-up: Return in about 2 weeks (around 04/02/2021) for LROB, CNM, in person.  Future Appointments  Date Time Provider Department Center  04/02/2021  3:10 PM [place], CNM [facility]    Orders  Placed This Encounter  Procedures   Tdap vaccine greater than or equal to 7yo IM   Cheral Marker CNM, [facility] 03/19/2021 10:42 AM

## 2021-04-02 ENCOUNTER — Ambulatory Visit (INDEPENDENT_AMBULATORY_CARE_PROVIDER_SITE_OTHER): Payer: Medicaid Other | Admitting: Advanced Practice Midwife

## 2021-04-02 ENCOUNTER — Other Ambulatory Visit: Payer: Self-pay

## 2021-04-02 ENCOUNTER — Encounter: Payer: Self-pay | Admitting: Advanced Practice Midwife

## 2021-04-02 VITALS — BP 135/84 | HR 123 | Wt 205.0 lb

## 2021-04-02 DIAGNOSIS — Z3A34 34 weeks gestation of pregnancy: Secondary | ICD-10-CM

## 2021-04-02 DIAGNOSIS — Z3403 Encounter for supervision of normal first pregnancy, third trimester: Secondary | ICD-10-CM

## 2021-04-02 NOTE — Progress Notes (Signed)
   LOW-RISK PREGNANCY VISIT Patient name: Dawn Blankenship MRN [id number]  Date of birth: 11/10/2000 Chief Complaint:   Routine Prenatal Visit (Swab for yeast)  History of Present Illness:   Dawn Blankenship is a 20 y.o. G1P0 female at [redacted]w[redacted]d with an Estimated Date of Delivery: 05/08/21 being seen today for ongoing management of a low-risk pregnancy.  Today she reports no complaints. Contractions: Not present.  .  Movement: Present. denies leaking of fluid. Review of Systems:   Pertinent items are noted in HPI Denies abnormal vaginal discharge w/ itching/odor/irritation, headaches, visual changes, shortness of breath, chest pain, abdominal pain, severe nausea/vomiting, or problems with urination or bowel movements unless otherwise stated above. Pertinent History Reviewed:  Reviewed past medical,surgical, social, obstetrical and family history.  Reviewed problem list, medications and allergies. Physical Assessment:   Vitals:   04/02/21 1511  BP: 135/84  Pulse: (!) 123  Weight: 205 lb (93 kg)  Body mass index is 35.19 kg/m.        Physical Examination:   General appearance: Well appearing, and in no distress  Mental status: Alert, oriented to person, place, and time  Skin: Warm & dry  Cardiovascular: Normal heart rate noted  Respiratory: Normal respiratory effort, no distress  Abdomen: Soft, gravid, nontender  Pelvic: Cervical exam deferred         Extremities: Edema: None  Fetal Status: Fetal Heart Rate (bpm): 135 Fundal Height: 35 cm Movement: Present Presentation: Vertex  No results found for this or any previous visit (from the past 24 hour(s)).  Assessment & Plan:  1) Low-risk pregnancy G1P0 at [redacted]w[redacted]d with an Estimated Date of Delivery: 05/08/21   2) No longer interested in waterbirth   Meds: No orders of the defined types were placed in this encounter.  Labs/procedures today: none  Plan:  Continue routine obstetrical care   Reviewed: Preterm labor symptoms and  general obstetric precautions including but not limited to vaginal bleeding, contractions, leaking of fluid and fetal movement were reviewed in detail with the patient.  All questions were answered. Has home bp cuff.  Check bp weekly, let us know if >140/90.   Follow-up: Return in about 2 weeks (around 04/16/2021) for LROB, in person; GBS/cultures.  No orders of the defined types were placed in this encounter.  Arabella Merles [facility] 04/02/2021 3:38 PM

## 2021-04-16 ENCOUNTER — Encounter: Payer: Self-pay | Admitting: Women's Health

## 2021-04-16 ENCOUNTER — Ambulatory Visit (INDEPENDENT_AMBULATORY_CARE_PROVIDER_SITE_OTHER): Payer: Medicaid Other | Admitting: Women's Health

## 2021-04-16 ENCOUNTER — Other Ambulatory Visit: Payer: Self-pay

## 2021-04-16 ENCOUNTER — Other Ambulatory Visit (HOSPITAL_COMMUNITY)
Admission: RE | Admit: 2021-04-16 | Discharge: 2021-04-16 | Disposition: A | Discharge status: Home / Self Care | Payer: Medicaid Other | Source: Ambulatory Visit | Attending: Women's Health | Admitting: Women's Health

## 2021-04-16 VITALS — BP 138/80 | HR 120 | Wt 210.0 lb

## 2021-04-16 DIAGNOSIS — Z3403 Encounter for supervision of normal first pregnancy, third trimester: Secondary | ICD-10-CM | POA: Diagnosis present

## 2021-04-16 DIAGNOSIS — Z3A36 36 weeks gestation of pregnancy: Secondary | ICD-10-CM

## 2021-04-16 LAB — OB RESULTS CONSOLE GBS: GBS: POSITIVE

## 2021-04-16 NOTE — Progress Notes (Addendum)
LOW-RISK PREGNANCY VISIT Patient name: Dawn Blankenship MRN [id number]  Date of birth: [birth year] Chief Complaint:   Routine Prenatal Visit  History of Present Illness:   Dawn Blankenship is a 20 y.o. G1P0 female at [redacted]w[redacted]d with an Estimated Date of Delivery: 05/08/21 being seen today for ongoing management of a low-risk pregnancy.   Today she reports no complaints. Contractions: Not present. Vag. Bleeding: None.  Movement: Present. denies leaking of fluid.  Depression screen [facility] 2/9 02/24/2021 10/23/2020 09/13/2020  Decreased Interest 0 0 1  Down, Depressed, Hopeless 0 1 0  PHQ - 2 Score 0 1 1  Altered sleeping 1 1 1   Tired, decreased energy 0 1 1  Change in appetite 0 0 0  Feeling bad or failure about yourself  0 1 0  Trouble concentrating 0 1 1  Moving slowly or fidgety/restless 0 0 0  Suicidal thoughts 0 0 0  PHQ-9 Score 1 5 4      GAD 7 : Generalized Anxiety Score 02/24/2021 10/23/2020 09/13/2020  Nervous, Anxious, on Edge 1 0 1  Control/stop worrying 0 1 1  Worry too much - different things 0 1 1  Trouble relaxing 1 1 0  Restless 0 0 1  Easily annoyed or irritable 1 1 1   Afraid - awful might happen 0 0 0  Total GAD 7 Score 3 4 5       Review of Systems:   Pertinent items are noted in HPI Denies abnormal vaginal discharge w/ itching/odor/irritation, headaches, visual changes, shortness of breath, chest pain, abdominal pain, severe nausea/vomiting, or problems with urination or bowel movements unless otherwise stated above. Pertinent History Reviewed:  Reviewed past medical,surgical, social, obstetrical and family history.  Reviewed problem list, medications and allergies. Physical Assessment:   Vitals:   04/16/21 1546  BP: 138/80  Pulse: (!) 120  Weight: 210 lb (95.3 kg)  Body mass index is 36.05 kg/m.        Physical Examination:   General appearance: Well appearing, and in no distress  Mental status: Alert, oriented to person, place, and time  Skin:  Warm & dry  Cardiovascular: Normal heart rate noted  Respiratory: Normal respiratory effort, no distress  Abdomen: Soft, gravid, nontender  Pelvic: Cervical exam performed  Dilation: Closed Effacement (%): Thick Station: Ballotable, exam fingers covered in thick clumpy yellow d/c c/w yeast  Extremities: Edema: Trace  Fetal Status: Fetal Heart Rate (bpm): 135 Fundal Height: 36 cm Movement: Present Presentation: Vertex  Chaperone: 11/13/2020   No results found for this or any previous visit (from the past 24 hour(s)).  Assessment & Plan:  1) Low-risk pregnancy G1P0 at [redacted]w[redacted]d with an Estimated Date of Delivery: 05/08/21   2) Vaginal candida> otc monistat 7   Meds: No orders of the defined types were placed in this encounter.  Labs/procedures today: GBS, GC/CT, and SVE  Plan:  Continue routine obstetrical care  Next visit: prefers in person    Reviewed: Term labor symptoms and general obstetric precautions including but not limited to vaginal bleeding, contractions, leaking of fluid and fetal movement were reviewed in detail with the patient.  All questions were answered. Does have home bp cuff. Office bp cuff given: not applicable. Check bp weekly, let 06/16/21 know if consistently >140 and/or >90.  Follow-up: Return for weekly, CNM, LROB, in person.  Future Appointments  Date Time Provider Department Center  04/23/2021  3:30 PM [redacted]w[redacted]d, DO [facility]    Orders Placed This  Encounter  Procedures   Strep Gp B NAA+Rflx   Cheral Marker CNM, [facility] 04/16/2021 4:23 PM

## 2021-04-16 NOTE — Patient Instructions (Addendum)
Donnis, thank you for choosing our office today! We appreciate the opportunity to meet your healthcare needs. You may receive a short survey by mail, e-mail, or through [employer]. If you are happy with your care we would appreciate if you could take just a few minutes to complete the survey questions. We read all of your comments and take your feedback very seriously. Thank you again for choosing our office.  Center for [employer] Healthcare Team at [facility]  [facility] at [facility] ([address] [place], [place] [address]) Entrance C, located off of [address] valet parking   You can try a 7 night over the counter yeast cream such as Monistat 7. It does not need to be brand name, generic is fine, but please make sure it is a 7 night cream.    CLASSES: Go to [email or web address] to register for classes (childbirth, breastfeeding, waterbirth, infant CPR, daddy bootcamp, etc.)  Call the office [phone]) or go to [facility] if: You begin to have strong, frequent contractions Your water breaks.  Sometimes it is a big gush of fluid, sometimes it is just a trickle that keeps getting your panties wet or running down your legs You have vaginal bleeding.  It is normal to have a small amount of spotting if your cervix was checked.  You don't feel your baby moving like normal.  If you don't, get you something to eat and drink and lay down and focus on feeling your baby move.   If your baby is still not moving like normal, you should call the office or go to [facility].  Call the office [phone]) or go to [facility] hospital for these signs of pre-eclampsia: Severe headache that does not go away with Tylenol Visual changes- seeing spots, double, blurred vision Pain under your right breast or upper abdomen that does not go away with Tums or heartburn medicine Nausea and/or vomiting Severe swelling in your hands, feet, and face   [facility] Pediatricians/Family  Doctors [facility]): [address] Colette Ribas, [phone]           [facility]: [address], [phone]                [facility] Family Medicine [facility]): [address], [phone] (call to ask if accepting patients) [facility] Department: [address] [place], [phone]    [facility] Pediatricians/Family Doctors [facility]): [address], [phone] [facility]: [address] [place], [phone] [facility]: [address], [phone]  [facility] Doctors  [facility]): [phone] [facility]: [address], [phone]   [facility] Doctors [facility]: [address] [phone]  [facility] Doctors  [facility]: [phone], [phone]  Home Blood Pressure Monitoring for Patients   Your provider has recommended that you check your blood pressure (BP) at least once a week at home. If you do not have a blood pressure cuff at home, one will be provided for you. Contact your provider if you have not received your monitor within 1 week.   Helpful Tips for Accurate Home Blood Pressure Checks  Don't smoke, exercise, or drink caffeine 30 minutes before checking your BP Use the restroom before checking your BP (a full bladder can raise your pressure) Relax in a comfortable upright chair Feet on  the ground Left arm resting comfortably on a flat surface at the level of your heart Legs uncrossed Back supported Sit quietly and don't talk Place the cuff on your bare arm Adjust snuggly, so that only two fingertips can fit between your skin and the top of the cuff Check 2 readings separated by at least one minute Keep a log of your BP readings For a visual, please reference this diagram: http://ccnc.care/bpdiagram  Provider Name: [facility]     Phone: [phone]  Zone 1: ALL CLEAR  Continue to monitor your symptoms:  BP reading is less than 140 (top number) or less than 90 (bottom number)  No right upper stomach pain No headaches or seeing spots No feeling nauseated or throwing up No swelling in face and hands  Zone 2: CAUTION Call your doctor's office for any of the following:  BP reading is greater than 140 (top number) or greater than 90 (bottom number)  Stomach pain under your ribs in the middle or right side Headaches or seeing spots Feeling nauseated or throwing up Swelling in face and hands  Zone 3: EMERGENCY  Seek immediate medical care if you have any of the following:  BP reading is greater than160 (top number) or greater than 110 (bottom number) Severe headaches not improving with Tylenol Serious difficulty catching your breath Any worsening symptoms from Zone 2   Braxton Hicks Contractions Contractions of the uterus can occur throughout pregnancy, but they are not always a sign that you are in labor. You may have practice contractions called Braxton Hicks contractions. These false labor contractions are sometimes confused with true labor. What are [place] contractions? Braxton Hicks contractions are tightening movements that occur in the muscles of the uterus before labor. Unlike true labor contractions, these contractions do not result in opening (dilation) and thinning of the cervix. Toward the end of pregnancy (32-34 weeks), Braxton Hicks contractions can happen more often and may become stronger. These contractions are sometimes difficult to tell apart from true labor because they can be very uncomfortable. You should not feel embarrassed if you go to the hospital with false labor. Sometimes, the only way to tell if you are in true labor is for your health care provider to look for changes in the cervix. The health care provider will do a physical exam and may monitor your contractions. If you  are not in true labor, the exam should show that your cervix is not dilating and your water has not broken. If there are no other health problems associated with your pregnancy, it is completely safe for you to be sent home with false labor. You may continue to have Braxton Hicks contractions until you go into true labor. How to tell the difference between true labor and false labor True labor Contractions last 30-70 seconds. Contractions become very regular. Discomfort is usually felt in the top of the uterus, and it spreads to the lower abdomen and low back. Contractions do not go away with walking. Contractions usually become more intense and increase in frequency. The cervix dilates and gets thinner. False labor Contractions are usually shorter and not as strong as true labor contractions. Contractions are usually irregular. Contractions are often felt in the front of the lower abdomen and in the groin. Contractions may go away when you walk around or change positions while lying down. Contractions get weaker and are shorter-lasting as time goes on. The cervix usually does not dilate or become thin. Follow these  instructions at home:  Take over-the-counter and prescription medicines only as told by your health care provider. Keep up with your usual exercises and follow other instructions from your health care provider. Eat and drink lightly if you think you are going into labor. If Braxton Hicks contractions are making you uncomfortable: Change your position from lying down or resting to walking, or change from walking to resting. Sit and rest in a tub of warm water. Drink enough fluid to keep your urine pale yellow. Dehydration may cause these contractions. Do slow and deep breathing several times an hour. Keep all follow-up prenatal visits as told by your health care provider. This is important. Contact a health care provider if: You have a fever. You have continuous pain in your  abdomen. Get help right away if: Your contractions become stronger, more regular, and closer together. You have fluid leaking or gushing from your vagina. You pass blood-tinged mucus (bloody show). You have bleeding from your vagina. You have low back pain that you never had before. You feel your baby's head pushing down and causing pelvic pressure. Your baby is not moving inside you as much as it used to. Summary Contractions that occur before labor are called Braxton Hicks contractions, false labor, or practice contractions. Braxton Hicks contractions are usually shorter, weaker, farther apart, and less regular than true labor contractions. True labor contractions usually become progressively stronger and regular, and they become more frequent. Manage discomfort from [facility] contractions by changing position, resting in a warm bath, drinking plenty of water, or practicing deep breathing. This information is not intended to replace advice given to you by your health care provider. Make sure you discuss any questions you have with your health care provider. Document Revised: 06/04/2017 Document Reviewed: 11/05/2016 Elsevier Patient Education  [place].

## 2021-04-18 LAB — CERVICOVAGINAL ANCILLARY ONLY
Chlamydia: NEGATIVE
Comment: NEGATIVE
Comment: NORMAL
Neisseria Gonorrhea: NEGATIVE

## 2021-04-20 LAB — STREP GP B SUSCEPTIBILITY

## 2021-04-20 LAB — SPECIMEN STATUS REPORT

## 2021-04-20 LAB — STREP GP B NAA+RFLX: Strep Gp B NAA+Rflx: POSITIVE — AB

## 2021-04-23 ENCOUNTER — Other Ambulatory Visit: Payer: Self-pay

## 2021-04-23 ENCOUNTER — Encounter: Payer: Self-pay | Admitting: Obstetrics & Gynecology

## 2021-04-23 ENCOUNTER — Ambulatory Visit (INDEPENDENT_AMBULATORY_CARE_PROVIDER_SITE_OTHER): Payer: Medicaid Other | Admitting: Obstetrics & Gynecology

## 2021-04-23 VITALS — BP 136/84 | HR 112 | Wt 207.4 lb

## 2021-04-23 DIAGNOSIS — Z3403 Encounter for supervision of normal first pregnancy, third trimester: Secondary | ICD-10-CM

## 2021-04-23 NOTE — Progress Notes (Signed)
   LOW-RISK PREGNANCY VISIT Patient name: Dawn Blankenship MRN [id number]  Date of birth: Sep 09, 2000 Chief Complaint:   Routine Prenatal Visit  History of Present Illness:   Dawn Blankenship is a 20 y.o. G1P0 female at [redacted]w[redacted]d with an Estimated Date of Delivery: 05/08/21 being seen today for ongoing management of a low-risk pregnancy.   Depression screen [facility] 2/9 02/24/2021 10/23/2020 09/13/2020  Decreased Interest 0 0 1  Down, Depressed, Hopeless 0 1 0  PHQ - 2 Score 0 1 1  Altered sleeping 1 1 1   Tired, decreased energy 0 1 1  Change in appetite 0 0 0  Feeling bad or failure about yourself  0 1 0  Trouble concentrating 0 1 1  Moving slowly or fidgety/restless 0 0 0  Suicidal thoughts 0 0 0  PHQ-9 Score 1 5 4     Today she reports no complaints. Contractions: Irritability. Vag. Bleeding: None.  Movement: Present. denies leaking of fluid. Review of Systems:   Pertinent items are noted in HPI Denies abnormal vaginal discharge w/ itching/odor/irritation- had yeast infection, now resolved, headaches, visual changes, shortness of breath, chest pain, abdominal pain, severe nausea/vomiting, or problems with urination or bowel movements unless otherwise stated above. Pertinent History Reviewed:  Reviewed past medical,surgical, social, obstetrical and family history.  Reviewed problem list, medications and allergies.  Physical Assessment:   Vitals:   04/23/21 1534  BP: 136/84  Pulse: (!) 112  Weight: 207 lb 6.4 oz (94.1 kg)  Body mass index is 35.6 kg/m.        Physical Examination:   General appearance: Well appearing, and in no distress  Mental status: Alert, oriented to person, place, and time  Skin: Warm & dry  Respiratory: Normal respiratory effort, no distress  Abdomen: Soft, gravid, nontender  Pelvic: Cervical exam deferred         Extremities: Edema: Trace  Psych:  mood and affect appropriate  Fetal Status: Fetal Heart Rate (bpm): 135 Fundal Height: 37 cm Movement:  Present    Chaperone: n/a    No results found for this or any previous visit (from the past 24 hour(s)).   Assessment & Plan:  1) Low-risk pregnancy G1P0 at [redacted]w[redacted]d with an Estimated Date of Delivery: 05/08/21   2) GBS positive- Vanc in labor   Meds: No orders of the defined types were placed in this encounter.  Labs/procedures today: none  Plan:  Continue routine obstetrical care  Next visit: prefers in person    Reviewed: Term labor symptoms and general obstetric precautions including but not limited to vaginal bleeding, contractions, leaking of fluid and fetal movement were reviewed in detail with the patient.  All questions were answered. Pt has home bp cuff. Check bp weekly, let [redacted]w[redacted]d know if >140/90.   Follow-up: Return in about 1 week (around 04/30/2021) for LROB visit.  No orders of the defined types were placed in this encounter.   [place], DO Attending Obstetrician & Gynecologist, [facility] for Myna Hidalgo, [facility]

## 2021-04-30 ENCOUNTER — Ambulatory Visit (INDEPENDENT_AMBULATORY_CARE_PROVIDER_SITE_OTHER): Payer: Medicaid Other | Admitting: Obstetrics & Gynecology

## 2021-04-30 ENCOUNTER — Other Ambulatory Visit: Payer: Self-pay

## 2021-04-30 VITALS — BP 123/77 | HR 73 | Wt 212.0 lb

## 2021-04-30 DIAGNOSIS — Z3A38 38 weeks gestation of pregnancy: Secondary | ICD-10-CM

## 2021-04-30 DIAGNOSIS — Z3403 Encounter for supervision of normal first pregnancy, third trimester: Secondary | ICD-10-CM

## 2021-04-30 NOTE — Progress Notes (Signed)
   LOW-RISK PREGNANCY VISIT Patient name: Dawn Blankenship MRN [id number]  Date of birth: 08-26-2000 Chief Complaint:   Routine Prenatal Visit  History of Present Illness:   Dawn Blankenship is a 20 y.o. G1P0 female at [redacted]w[redacted]d with an Estimated Date of Delivery: 05/08/21 being seen today for ongoing management of a low-risk pregnancy.   Depression screen [facility] 2/9 02/24/2021 10/23/2020 09/13/2020  Decreased Interest 0 0 1  Down, Depressed, Hopeless 0 1 0  PHQ - 2 Score 0 1 1  Altered sleeping 1 1 1   Tired, decreased energy 0 1 1  Change in appetite 0 0 0  Feeling bad or failure about yourself  0 1 0  Trouble concentrating 0 1 1  Moving slowly or fidgety/restless 0 0 0  Suicidal thoughts 0 0 0  PHQ-9 Score 1 5 4     Today she reports no complaints. Contractions: Irritability. Vag. Bleeding: None.  Movement: Present. denies leaking of fluid. Review of Systems:   Pertinent items are noted in HPI Denies abnormal vaginal discharge w/ itching/odor/irritation, headaches, visual changes, shortness of breath, chest pain, abdominal pain, severe nausea/vomiting, or problems with urination or bowel movements unless otherwise stated above. Pertinent History Reviewed:  Reviewed past medical,surgical, social, obstetrical and family history.  Reviewed problem list, medications and allergies.  Physical Assessment:   Vitals:   04/30/21 1504  BP: 123/77  Pulse: 73  Weight: 212 lb (96.2 kg)  Body mass index is 36.39 kg/m.        Physical Examination:   General appearance: Well appearing, and in no distress  Mental status: Alert, oriented to person, place, and time  Skin: Warm & dry  Respiratory: Normal respiratory effort, no distress  Abdomen: Soft, gravid, nontender  Pelvic: Cervical exam performed  Dilation: Closed Effacement (%): 20 Station: Ballotable  Extremities: Edema: None  Psych:  mood and affect appropriate  Fetal Status: Fetal Heart Rate (bpm): 140 Fundal Height: 38 cm  Movement: Present Presentation: Vertex  Chaperone:  declined     No results found for this or any previous visit (from the past 24 hour(s)).   Assessment & Plan:  1) Low-risk pregnancy G1P0 at [redacted]w[redacted]d with an Estimated Date of Delivery: 05/08/21   2) GBS positive- Vanc in labor continue routine care, discussed IOL around 41wk   Meds: No orders of the defined types were placed in this encounter.  Labs/procedures today: none  Plan:  Continue routine obstetrical care  Next visit: prefers in person    Reviewed: Term labor symptoms and general obstetric precautions including but not limited to vaginal bleeding, contractions, leaking of fluid and fetal movement were reviewed in detail with the patient.  All questions were answered. Pt has home bp cuff. Check bp weekly, let [redacted]w[redacted]d know if >140/90.   Follow-up: Return in about 1 week (around 05/07/2021) for LROB visit.  No orders of the defined types were placed in this encounter.   [place], DO Attending Obstetrician & Gynecologist, [facility] for Myna Hidalgo, [facility]

## 2021-05-07 ENCOUNTER — Other Ambulatory Visit: Payer: Self-pay

## 2021-05-07 ENCOUNTER — Ambulatory Visit (INDEPENDENT_AMBULATORY_CARE_PROVIDER_SITE_OTHER): Payer: Medicaid Other | Admitting: Advanced Practice Midwife

## 2021-05-07 VITALS — BP 136/74 | HR 84 | Wt 212.0 lb

## 2021-05-07 DIAGNOSIS — Z3480 Encounter for supervision of other normal pregnancy, unspecified trimester: Secondary | ICD-10-CM

## 2021-05-07 DIAGNOSIS — Z3A39 39 weeks gestation of pregnancy: Secondary | ICD-10-CM

## 2021-05-07 NOTE — Patient Instructions (Signed)
Your induction is scheduled for Nov 12th. You should be there on Nov 11th at 11:45pm  Go to the main desk at the [facility] and let them know you are there to be induced. They will send someone from Labor & Delivery to come get you.  You will get a call from a nurse from the hospital within the next day or so to go over some information, she will also schedule you to go to the hospital a few days before you induction to have your Covid test. If you have any questions, please let us know.    Try these positions to help labor start: [email or web address]

## 2021-05-07 NOTE — Progress Notes (Signed)
   LOW-RISK PREGNANCY VISIT Patient name: Dawn Blankenship MRN [id number]  Date of birth: Aug 03, 2000 Chief Complaint:   Routine Prenatal Visit  History of Present Illness:   Dawn Blankenship is a 19 y.o. G1P0 female at [redacted]w[redacted]d with an Estimated Date of Delivery: 05/08/21 being seen today for ongoing management of a low-risk pregnancy.  Today she reports occasional contractions. Contractions: Irritability. Vag. Bleeding: None.  Movement: Present. denies leaking of fluid. Review of Systems:   Pertinent items are noted in HPI Denies abnormal vaginal discharge w/ itching/odor/irritation, headaches, visual changes, shortness of breath, chest pain, abdominal pain, severe nausea/vomiting, or problems with urination or bowel movements unless otherwise stated above. Pertinent History Reviewed:  Reviewed past medical,surgical, social, obstetrical and family history.  Reviewed problem list, medications and allergies. Physical Assessment:   Vitals:   05/07/21 1607  BP: 136/74  Pulse: 84  Weight: 212 lb (96.2 kg)  Body mass index is 36.39 kg/m.        Physical Examination:   General appearance: Well appearing, and in no distress  Mental status: Alert, oriented to person, place, and time  Skin: Warm & dry  Cardiovascular: Normal heart rate noted  Respiratory: Normal respiratory effort, no distress  Abdomen: Soft, gravid, nontender  Pelvic: Cervical exam performed  Dilation: Fingertip Effacement (%): Thick Station: -2  Extremities: Edema: None  Fetal Status: Fetal Heart Rate (bpm): 160 Fundal Height: 40 cm Movement: Present Presentation: Vertex  No results found for this or any previous visit (from the past 24 hour(s)).  Assessment & Plan:  1) Low-risk pregnancy G1P0 at [redacted]w[redacted]d with an Estimated Date of Delivery: 05/08/21     Meds: No orders of the defined types were placed in this encounter.  Labs/procedures today: SVE  Plan:  Continue routine obstetrical care; will have NST on  Monday; IOL form faxed via Epic and orders placed for 05/17/21 at [facility]  Reviewed: Term labor symptoms and general obstetric precautions including but not limited to vaginal bleeding, contractions, leaking of fluid and fetal movement were reviewed in detail with the patient.  All questions were answered. Has home bp cuff.  Check bp weekly, let us know if >140/90.   Follow-up: Return in about 5 days (around 05/12/2021) for LROB, NST.  No orders of the defined types were placed in this encounter.  Arabella Merles CNM 05/07/2021 4:42 PM

## 2021-05-12 ENCOUNTER — Ambulatory Visit (INDEPENDENT_AMBULATORY_CARE_PROVIDER_SITE_OTHER): Payer: Medicaid Other | Admitting: *Deleted

## 2021-05-12 ENCOUNTER — Other Ambulatory Visit: Payer: Self-pay

## 2021-05-12 VITALS — BP 135/82 | HR 92 | Wt 213.3 lb

## 2021-05-12 DIAGNOSIS — O480 Post-term pregnancy: Secondary | ICD-10-CM | POA: Diagnosis not present

## 2021-05-12 DIAGNOSIS — Z3403 Encounter for supervision of normal first pregnancy, third trimester: Secondary | ICD-10-CM | POA: Diagnosis not present

## 2021-05-12 NOTE — Progress Notes (Addendum)
   NURSE VISIT- NST  SUBJECTIVE:  Dawn Blankenship is a 20 y.o. G1P0 female at [redacted]w[redacted]d, here for a NST for pregnancy complicated by Post dates.  She reports active fetal movement, contractions: none, vaginal bleeding: none, membranes: intact.   OBJECTIVE:  BP 135/82   Pulse 92   Wt 213 lb 4.8 oz (96.8 kg)   LMP 08/01/2020 (Exact Date)   BMI 36.61 kg/m   Appears well, no apparent distress  No results found for this or any previous visit (from the past 24 hour(s)).  NST: FHR baseline 150 bpm, Variability: moderate, Accelerations:present, Decelerations:  Absent= Cat 1/reactive Toco: UI   ASSESSMENT: G1P0 at [redacted]w[redacted]d with Post dates NST reactive  PLAN: EFM strip reviewed by Joellyn Haff, CNM, [facility]   Recommendations: keep next appointment as scheduled    Jobe Marker  05/12/2021 3:54 PM  Chart reviewed for nurse visit. Agree with plan of care.  Cheral Marker, [place] 05/12/2021 5:06 PM

## 2021-05-13 ENCOUNTER — Other Ambulatory Visit: Payer: Self-pay

## 2021-05-13 ENCOUNTER — Inpatient Hospital Stay (HOSPITAL_COMMUNITY): Payer: Medicaid Other | Admitting: Anesthesiology

## 2021-05-13 ENCOUNTER — Inpatient Hospital Stay (HOSPITAL_COMMUNITY)
Admission: AD | Admit: 2021-05-13 | Discharge: 2021-05-16 | DRG: 807 | Disposition: A | Discharge status: Home / Self Care | Payer: Medicaid Other | Attending: Family Medicine | Admitting: Family Medicine

## 2021-05-13 ENCOUNTER — Encounter (HOSPITAL_COMMUNITY): Payer: Self-pay | Admitting: Obstetrics and Gynecology

## 2021-05-13 DIAGNOSIS — O4292 Full-term premature rupture of membranes, unspecified as to length of time between rupture and onset of labor: Principal | ICD-10-CM | POA: Diagnosis present

## 2021-05-13 DIAGNOSIS — Z20822 Contact with and (suspected) exposure to covid-19: Secondary | ICD-10-CM | POA: Diagnosis present

## 2021-05-13 DIAGNOSIS — Z3403 Encounter for supervision of normal first pregnancy, third trimester: Secondary | ICD-10-CM

## 2021-05-13 DIAGNOSIS — Z880 Allergy status to penicillin: Secondary | ICD-10-CM

## 2021-05-13 DIAGNOSIS — O139 Gestational [pregnancy-induced] hypertension without significant proteinuria, unspecified trimester: Secondary | ICD-10-CM | POA: Diagnosis not present

## 2021-05-13 DIAGNOSIS — O480 Post-term pregnancy: Secondary | ICD-10-CM | POA: Diagnosis not present

## 2021-05-13 DIAGNOSIS — O99824 Streptococcus B carrier state complicating childbirth: Secondary | ICD-10-CM | POA: Diagnosis present

## 2021-05-13 DIAGNOSIS — O26893 Other specified pregnancy related conditions, third trimester: Secondary | ICD-10-CM | POA: Diagnosis present

## 2021-05-13 DIAGNOSIS — Z87891 Personal history of nicotine dependence: Secondary | ICD-10-CM

## 2021-05-13 DIAGNOSIS — Z3A40 40 weeks gestation of pregnancy: Secondary | ICD-10-CM

## 2021-05-13 DIAGNOSIS — O134 Gestational [pregnancy-induced] hypertension without significant proteinuria, complicating childbirth: Secondary | ICD-10-CM | POA: Diagnosis not present

## 2021-05-13 DIAGNOSIS — O133 Gestational [pregnancy-induced] hypertension without significant proteinuria, third trimester: Secondary | ICD-10-CM

## 2021-05-13 DIAGNOSIS — O4202 Full-term premature rupture of membranes, onset of labor within 24 hours of rupture: Secondary | ICD-10-CM | POA: Diagnosis not present

## 2021-05-13 LAB — COMPREHENSIVE METABOLIC PANEL
ALT: 11 U/L (ref 0–44)
AST: 17 U/L (ref 15–41)
Albumin: 3.3 g/dL — ABNORMAL LOW (ref 3.5–5.0)
Alkaline Phosphatase: 185 U/L — ABNORMAL HIGH (ref 38–126)
Anion gap: 11 (ref 5–15)
BUN: 9 mg/dL (ref 6–20)
CO2: 21 mmol/L — ABNORMAL LOW (ref 22–32)
Calcium: 9.3 mg/dL (ref 8.9–10.3)
Chloride: 103 mmol/L (ref 98–111)
Creatinine, Ser: 0.62 mg/dL (ref 0.44–1.00)
GFR, Estimated: >60 mL/min (ref >60)
Glucose, Bld: 95 mg/dL (ref 70–99)
Potassium: 3.8 mmol/L (ref 3.5–5.1)
Sodium: 135 mmol/L (ref 135–145)
Total Bilirubin: 0.4 mg/dL (ref 0.3–1.2)
Total Protein: 6.5 g/dL (ref 6.5–8.1)

## 2021-05-13 LAB — TYPE AND SCREEN
ABO/RH(D): A POS
Antibody Screen: NEGATIVE

## 2021-05-13 LAB — PROTEIN / CREATININE RATIO, URINE
Creatinine, Urine: 122.05 mg/dL
Protein Creatinine Ratio: 0.11 mg/mg{Cre} (ref 0.00–0.15)
Total Protein, Urine: 13 mg/dL

## 2021-05-13 LAB — CBC
HCT: 38.5 % (ref 36.0–46.0)
Hemoglobin: 12.6 g/dL (ref 12.0–15.0)
MCH: 28.2 pg (ref 26.0–34.0)
MCHC: 32.7 g/dL (ref 30.0–36.0)
MCV: 86.1 fL (ref 80.0–100.0)
Platelets: 302 10*3/uL (ref 150–400)
RBC: 4.47 MIL/uL (ref 3.87–5.11)
RDW: 14.5 % (ref 11.5–15.5)
WBC: 15.8 10*3/uL — ABNORMAL HIGH (ref 4.0–10.5)
nRBC: 0 % (ref 0.0–0.2)

## 2021-05-13 LAB — RESP PANEL BY RT-PCR (FLU A&B, COVID) ARPGX2
Influenza A by PCR: NEGATIVE
Influenza B by PCR: NEGATIVE
SARS Coronavirus 2 by RT PCR: NEGATIVE

## 2021-05-13 LAB — RAPID HIV SCREEN (HIV 1/2 AB+AG)
HIV 1/2 Antibodies: NONREACTIVE
HIV-1 P24 Antigen - HIV24: NONREACTIVE

## 2021-05-13 LAB — POCT FERN TEST: POCT Fern Test: POSITIVE

## 2021-05-13 MED ORDER — PHENYLEPHRINE 40 MCG/ML (10ML) SYRINGE FOR IV PUSH (FOR BLOOD PRESSURE SUPPORT): 80.0000 ug | PREFILLED_SYRINGE | INTRAVENOUS | Status: DC | PRN

## 2021-05-13 MED ORDER — SOD CITRATE-CITRIC ACID 500-334 MG/5ML PO SOLN: 30.0000 mL | ORAL | Status: DC | PRN

## 2021-05-13 MED ORDER — OXYCODONE-ACETAMINOPHEN 5-325 MG PO TABS: 2.0000 | ORAL_TABLET | ORAL | Status: DC | PRN

## 2021-05-13 MED ORDER — LACTATED RINGERS IV SOLN: 500.0000 mL | Freq: Once | INTRAVENOUS | Status: DC

## 2021-05-13 MED ORDER — DIPHENHYDRAMINE HCL 50 MG/ML IJ SOLN: 12.5000 mg | INTRAMUSCULAR | Status: DC | PRN

## 2021-05-13 MED ORDER — ONDANSETRON HCL 4 MG/2ML IJ SOLN: 4.0000 mg | Freq: Four times a day (QID) | INTRAMUSCULAR | Status: DC | PRN

## 2021-05-13 MED ORDER — EPHEDRINE 5 MG/ML INJ: 10.0000 mg | INTRAVENOUS | Status: DC | PRN

## 2021-05-13 MED ORDER — LACTATED RINGERS IV SOLN: INTRAVENOUS | Status: DC

## 2021-05-13 MED ORDER — TERBUTALINE SULFATE 1 MG/ML IJ SOLN
0.2500 mg | Freq: Once | INTRAMUSCULAR | Status: AC | PRN
  Administered 2021-05-14: 0.25 mg via SUBCUTANEOUS
  Filled 2021-05-13: qty 1

## 2021-05-13 MED ORDER — OXYTOCIN-SODIUM CHLORIDE 30-0.9 UT/500ML-% IV SOLN
2.5000 [IU]/h | INTRAVENOUS | Status: DC
  Administered 2021-05-14: 2.5 [IU]/h via INTRAVENOUS
  Filled 2021-05-13: qty 500

## 2021-05-13 MED ORDER — LACTATED RINGERS IV SOLN: 500.0000 mL | INTRAVENOUS | Status: DC | PRN

## 2021-05-13 MED ORDER — LIDOCAINE HCL (PF) 1 % IJ SOLN
INTRAMUSCULAR | Status: DC | PRN
  Administered 2021-05-13 (×2): 4 mL via EPIDURAL

## 2021-05-13 MED ORDER — MISOPROSTOL 25 MCG QUARTER TABLET: 25.0000 ug | ORAL_TABLET | ORAL | Status: DC | PRN

## 2021-05-13 MED ORDER — VANCOMYCIN HCL IN DEXTROSE 1-5 GM/200ML-% IV SOLN
1000.0000 mg | Freq: Two times a day (BID) | INTRAVENOUS | Status: DC
  Administered 2021-05-13 – 2021-05-14 (×2): 1000 mg via INTRAVENOUS
  Filled 2021-05-13 (×2): qty 200

## 2021-05-13 MED ORDER — LACTATED RINGERS IV SOLN
500.0000 mL | Freq: Once | INTRAVENOUS | Status: AC
  Administered 2021-05-13: 500 mL via INTRAVENOUS

## 2021-05-13 MED ORDER — FENTANYL-BUPIVACAINE-NACL 0.5-0.125-0.9 MG/250ML-% EP SOLN
12.0000 mL/h | EPIDURAL | Status: DC | PRN
  Filled 2021-05-13: qty 250

## 2021-05-13 MED ORDER — OXYTOCIN BOLUS FROM INFUSION
333.0000 mL | Freq: Once | INTRAVENOUS | Status: AC
  Administered 2021-05-14: 333 mL via INTRAVENOUS

## 2021-05-13 MED ORDER — ACETAMINOPHEN 325 MG PO TABS: 650.0000 mg | ORAL_TABLET | ORAL | Status: DC | PRN

## 2021-05-13 MED ORDER — OXYCODONE-ACETAMINOPHEN 5-325 MG PO TABS: 1.0000 | ORAL_TABLET | ORAL | Status: DC | PRN

## 2021-05-13 MED ORDER — FENTANYL CITRATE (PF) 100 MCG/2ML IJ SOLN: 100.0000 ug | INTRAMUSCULAR | Status: DC | PRN

## 2021-05-13 MED ORDER — LIDOCAINE HCL (PF) 1 % IJ SOLN
30.0000 mL | INTRAMUSCULAR | Status: DC | PRN
  Filled 2021-05-13: qty 30

## 2021-05-13 NOTE — Progress Notes (Signed)
Labor Progress Note Dawn Blankenship is a 20 y.o. G1P0 at [redacted]w[redacted]d admitted for SROM.  S: Resting in bed, breathing through contractions.  O:  BP (!) 141/73   Pulse 74   Temp 98.1 F (36.7 C) (Oral)   Resp 16   Ht 5\' 4"  (1.626 m)   Wt 95.5 kg   LMP 08/01/2020 (Exact Date)   SpO2 98%   BMI 36.15 kg/m  EFM: baseline 130/moderate variability/accelerations present, some variable decelerations present  CVE: Dilation: 5 Effacement (%): 90 Cervical Position: Middle Station: -1 Presentation: Vertex Exam by:: Dr. [email or web address]   A&P: 20 y.o. G1P0 [redacted]w[redacted]d admitted for SROM. #Labor: Progressing well. Continue expectant management. #Pain: Planning for epidural #FWB: Category II. IV bolus initiated by Dr. [redacted]w[redacted]d at last check and positional changes implemented as needed. #GBS positive. On Vanc (PCN allergy, clinda resistant) #gHTN: New intrapartum diagnosis. Has now had 2 elevated pressures (systolics 140s) 4 hours apart. No severe range pressures. PreE labs negative. Will continue to monitor pressures.   Ephriam Jenkins, MD 9:51 PM

## 2021-05-13 NOTE — Progress Notes (Signed)
Dawn Blankenship is a 20 y.o. G1P0 at [redacted]w[redacted]d admitted for SROM  Subjective: Reports feels contractions get stronger. States moving around helps  Objective: BP 134/73   Pulse 95   Temp 98.1 F (36.7 C) (Oral)   Resp 16   Ht 5\' 4"  (1.626 m)   Wt 95.5 kg   LMP 08/01/2020 (Exact Date)   SpO2 98%   BMI 36.15 kg/m  I/O last 3 completed shifts: In: 279.2 [I.V.:79.2; IV Piggyback:200] Out: -  No intake/output data recorded.  FHT:  FHR: 145 bpm, variability: moderate,  accelerations:  Present,  decelerations:  Present after last two contractions, position being changed UC:   regular, every 1-3 minutes SVE:   Dilation: 5 Effacement (%): 90 Station: -1 Exam by:: Dr [email or web address]  Labs: Lab Results  Component Value Date   WBC 15.8 (H) 05/13/2021   HGB 12.6 05/13/2021   HCT 38.5 05/13/2021   MCV 86.1 05/13/2021   PLT 302 05/13/2021    Assessment / Plan:  Labor: Progressing normally, continue expectant management. CatII for decels after last two contractions after check, will IV bolus and position change. If has variables that are recurrent can do amnioinfusion. If contractions space out can consider pitocin Preeclampsia:  labs stable and BP in 130s. Continue to monitor Fetal Wellbeing:  Category II, see above Pain Control:   Plans epidural I/D:   GBS positive, PCN allergy and Clinda resistant - on Vanc  13/02/2021 05/13/2021, 8:55 PM

## 2021-05-13 NOTE — Anesthesia Procedure Notes (Signed)
Epidural Patient location during procedure: OB Start time: 05/13/2021 10:37 PM End time: 05/13/2021 10:41 PM  Staffing Anesthesiologist: Kaylyn Layer, MD Performed: anesthesiologist   Preanesthetic Checklist Completed: patient identified, IV checked, risks and benefits discussed, monitors and equipment checked, pre-op evaluation and timeout performed  Epidural Patient position: sitting Prep: DuraPrep and site prepped and draped Patient monitoring: continuous pulse ox, blood pressure and heart rate Approach: midline Location: L3-L4 Injection technique: LOR air  Needle:  Needle type: Tuohy  Needle gauge: 17 G Needle length: 9 cm Needle insertion depth: 5 cm Catheter type: closed end flexible Catheter size: 19 Gauge Catheter at skin depth: 10 cm Test dose: negative and Other (1% lidocaine)  Assessment Events: blood not aspirated, injection not painful, no injection resistance, no paresthesia and negative IV test  Additional Notes Patient identified. Risks, benefits, and alternatives discussed with patient including but not limited to bleeding, infection, nerve damage, paralysis, failed block, incomplete pain control, headache, blood pressure changes, nausea, vomiting, reactions to medication, itching, and postpartum back pain. Confirmed with bedside nurse the patient's most recent platelet count. Confirmed with patient that they are not currently taking any anticoagulation, have any bleeding history, or any family history of bleeding disorders. Patient expressed understanding and wished to proceed. All questions were answered. Sterile technique was used throughout the entire procedure. Please see nursing notes for vital signs.   Crisp LOR on first pass. Test dose was given through epidural catheter and negative prior to continuing to dose epidural or start infusion. Warning signs of high block given to the patient including shortness of breath, tingling/numbness in hands, complete  motor block, or any concerning symptoms with instructions to call for help. Patient was given instructions on fall risk and not to get out of bed. All questions and concerns addressed with instructions to call with any issues or inadequate analgesia.  Reason for block:procedure for pain

## 2021-05-13 NOTE — MAU Note (Signed)
Having real bad contractions.  Started last night. Have gotten stronger. 4-5 min apart. Feels like she is leaking,around 1330, clear fluid- still coming.

## 2021-05-13 NOTE — H&P (Addendum)
OBSTETRIC ADMISSION HISTORY AND PHYSICAL  Dawn Blankenship is a 20 y.o. female G1P0 with IUP at [redacted]w[redacted]d by LMP presenting for SROM and onset of labor. She reports +FMs, +LOF, no VB, no blurry vision, headaches or peripheral edema, and RUQ pain.  She plans on bottle feeding. She requests OCPs for birth control. She received her prenatal care at [facility]   Dating: By LMP --->  Estimated Date of Delivery: 05/08/21 Sono:  @[redacted]w[redacted]d , normal anatomy, cephalic presentation, posterior placenta, 371g, 51% EFW  Prenatal History/Complications: None  Past Medical History: Past Medical History:  Diagnosis Date   Medical history non-contributory     Past Surgical History: Past Surgical History:  Procedure Laterality Date   NO PAST SURGERIES      Obstetrical History: OB History     Gravida  1   Para      Term      Preterm      AB      Living         SAB      IAB      Ectopic      Multiple      Live Births              Social History Social History   Socioeconomic History   Marital status: Single    Spouse name: Not on file   Number of children: Not on file   Years of education: Not on file   Highest education level: Not on file  Occupational History   Not on file  Tobacco Use   Smoking status: Former   Smokeless tobacco: Never  Vaping Use   Vaping Use: Former  Substance and Sexual Activity   Alcohol use: No   Drug use: No   Sexual activity: Yes    Birth control/protection: None  Other Topics Concern   Not on file  Social History Narrative   Not on file   Social Determinants of Health   Financial Resource Strain: Low Risk    Difficulty of Paying Living Expenses: Not hard at all  Food Insecurity: No Food Insecurity   Worried About in the Last Year: Never true   Ran Out of Food in the Last Year: Never true  Transportation Needs: No Transportation Needs   Lack of Transportation (Medical): No   Lack of Transportation  (Non-Medical): No  Physical Activity: Insufficiently Active   Days of Exercise per Week: 3 days   Minutes of Exercise per Session: 20 min  Stress: No Stress Concern Present   Feeling of Stress : Only a little  Social Connections: Moderately Isolated   Frequency of Communication with Friends and Family: Twice a week   Frequency of Social Gatherings with Friends and Family: Once a week   Attends Religious Services: Never   Programme researcher, broadcasting/film/video or Organizations: No   Attends Database administrator: Never   Marital Status: Living with partner    Family History: Family History  Problem Relation Age of Onset   Diabetes Paternal Grandmother    Asthma Maternal Grandmother    Diabetes Maternal Grandfather    Cancer Mother     Allergies: Allergies  Allergen Reactions   Cinnamon Anaphylaxis   Penicillins Hives and Itching    Medications Prior to Admission  Medication Sig Dispense Refill Last Dose   aspirin EC 81 MG tablet Take 2 tablets (162 mg total) by mouth daily. (Patient taking differently: Take 162 mg  by mouth daily. Pt takes 2 tablets every other day) 60 tablet 6    Prenatal Vit-Fe Fumarate-FA (MULTIVITAMIN-PRENATAL) 27-0.8 MG TABS tablet Take 1 tablet by mouth daily at 12 noon.      promethazine (PHENERGAN) 25 MG tablet Take 1 tablet (25 mg total) by mouth every 6 (six) hours as needed for nausea or vomiting. (Patient not taking: No sig reported) 30 tablet 1     Review of Systems  All systems reviewed and negative except as stated in HPI  Blood pressure (!) 145/80, pulse (!) 118, temperature 99.1 F (37.3 C), temperature source Oral, resp. rate 20, height 5\' 4"  (1.626 m), weight 95.5 kg, last menstrual period 08/01/2020, SpO2 98 %. General appearance: alert and cooperative Lungs: clear to auscultation bilaterally Heart: regular rate and rhythm Abdomen: soft, non-tender; bowel sounds normal Extremities: Homans sign is negative, no sign of DVT Presentation:  cephalic Fetal monitoring: Baseline: 145 bpm, Variability: Good {> 6 bpm), Accelerations: Reactive, and Decelerations: Absent Uterine activity: None    Prenatal labs: ABO, Rh: A/Positive/-- (04/20 1158) Antibody: Negative (08/22 0830) Rubella: 3.47 (04/20 1158) RPR: Non Reactive (08/22 0830)  HBsAg: Negative (04/20 1158)  HIV: Non Reactive (08/22 0830)  GBS: --/Positive (10/12 0000)  GTT: fasting 86, 1hr 105, 2hr 121 (all wnl) Genetic screening: NT/IT negative, panorama low risk female Anatomy 06-15-1972: normal  Prenatal Transfer Tool  Maternal Diabetes: No Genetic Screening: Normal Maternal Ultrasounds/Referrals: Normal Fetal Ultrasounds or other Referrals:  None Maternal Substance Abuse:  No Significant Maternal Medications:  None Significant Maternal Lab Results: Group B Strep positive  No results found for this or any previous visit (from the past 24 hour(s)).  Patient Active Problem List   Diagnosis Date Noted   Supervision of normal first pregnancy 10/23/2020    Assessment/Plan:  Dawn Blankenship is a 20 y.o. G1P0 at [redacted]w[redacted]d here for SROM and onset of labor.  #Labor: Progressing in labor on her own.  #Pain: PRN. Epidural when necessary. #Fetal Well Being: Category I #ID: Group B Strep positive, PCN allergic, clinda resistant > Vanc #Method Of Feeding: bottle feeding #Method Of Contraception: oral contraceptives (estrogen/progesterone) #Circ: N/A #elevated blood pressures: continuing to monitor. Pre-E labs drawn.   [redacted]w[redacted]d, DO 05/13/2021, 4:19 PM PGY-1, [facility]   Attestation of Attending Supervision of Resident: Evaluation and management procedures were performed by the [facility] Medicine Resident under my supervision. I was immediately available for direct supervision, assistance and direction throughout this encounter.  I also confirm that I have verified the information documented in the resident's note, and that I have also personally  reperformed the pertinent components of the physical exam and all of the medical decision making activities.   [facility], DO [facility] for [facility] 05/13/2021  8:20 PM

## 2021-05-13 NOTE — Anesthesia Preprocedure Evaluation (Signed)
Anesthesia Evaluation  Patient identified by MRN, date of birth, ID band Patient awake    Reviewed: Allergy & Precautions, Patient's Chart, lab work & pertinent test results  History of Anesthesia Complications Negative for: history of anesthetic complications  Airway Mallampati: II  TM Distance: >3 FB Neck ROM: Full    Dental no notable dental hx.    Pulmonary former smoker,    Pulmonary exam normal        Cardiovascular negative cardio ROS Normal cardiovascular exam     Neuro/Psych negative neurological ROS  negative psych ROS   GI/Hepatic negative GI ROS, Neg liver ROS,   Endo/Other  negative endocrine ROS  Renal/GU negative Renal ROS  negative genitourinary   Musculoskeletal negative musculoskeletal ROS (+)   Abdominal   Peds  Hematology negative hematology ROS (+)   Anesthesia Other Findings Day of surgery medications reviewed with patient.  Reproductive/Obstetrics (+) Pregnancy                             Anesthesia Physical Anesthesia Plan  ASA: 2  Anesthesia Plan: Epidural   Post-op Pain Management:    Induction:   PONV Risk Score and Plan: Treatment may vary due to age or medical condition  Airway Management Planned: Natural Airway  Additional Equipment:   Intra-op Plan:   Post-operative Plan:   Informed Consent: I have reviewed the patients History and Physical, chart, labs and discussed the procedure including the risks, benefits and alternatives for the proposed anesthesia with the patient or authorized representative who has indicated his/her understanding and acceptance.       Plan Discussed with:   Anesthesia Plan Comments:         Anesthesia Quick Evaluation

## 2021-05-14 ENCOUNTER — Encounter (HOSPITAL_COMMUNITY): Payer: Self-pay | Admitting: Obstetrics & Gynecology

## 2021-05-14 DIAGNOSIS — O4202 Full-term premature rupture of membranes, onset of labor within 24 hours of rupture: Secondary | ICD-10-CM

## 2021-05-14 DIAGNOSIS — O139 Gestational [pregnancy-induced] hypertension without significant proteinuria, unspecified trimester: Secondary | ICD-10-CM

## 2021-05-14 DIAGNOSIS — Z3A40 40 weeks gestation of pregnancy: Secondary | ICD-10-CM

## 2021-05-14 DIAGNOSIS — O480 Post-term pregnancy: Secondary | ICD-10-CM

## 2021-05-14 DIAGNOSIS — O134 Gestational [pregnancy-induced] hypertension without significant proteinuria, complicating childbirth: Secondary | ICD-10-CM

## 2021-05-14 DIAGNOSIS — O99820 Streptococcus B carrier state complicating pregnancy: Secondary | ICD-10-CM | POA: Insufficient documentation

## 2021-05-14 LAB — RPR: RPR Ser Ql: NONREACTIVE

## 2021-05-14 MED ORDER — PRENATAL MULTIVITAMIN CH
1.0000 | ORAL_TABLET | Freq: Every day | ORAL | Status: DC
  Administered 2021-05-14 – 2021-05-16 (×3): 1 via ORAL
  Filled 2021-05-14 (×3): qty 1

## 2021-05-14 MED ORDER — DIPHENHYDRAMINE HCL 25 MG PO CAPS: 25.0000 mg | ORAL_CAPSULE | Freq: Four times a day (QID) | ORAL | Status: DC | PRN

## 2021-05-14 MED ORDER — NIFEDIPINE ER OSMOTIC RELEASE 30 MG PO TB24
30.0000 mg | ORAL_TABLET | Freq: Every day | ORAL | Status: DC
  Administered 2021-05-14 – 2021-05-16 (×3): 30 mg via ORAL
  Filled 2021-05-14 (×3): qty 1

## 2021-05-14 MED ORDER — TETANUS-DIPHTH-ACELL PERTUSSIS 5-2.5-18.5 LF-MCG/0.5 IM SUSY: 0.5000 mL | PREFILLED_SYRINGE | Freq: Once | INTRAMUSCULAR | Status: DC

## 2021-05-14 MED ORDER — ONDANSETRON HCL 4 MG PO TABS: 4.0000 mg | ORAL_TABLET | ORAL | Status: DC | PRN

## 2021-05-14 MED ORDER — ONDANSETRON HCL 4 MG/2ML IJ SOLN: 4.0000 mg | INTRAMUSCULAR | Status: DC | PRN

## 2021-05-14 MED ORDER — COCONUT OIL OIL: 1.0000 "application " | TOPICAL_OIL | Status: DC | PRN

## 2021-05-14 MED ORDER — BENZOCAINE-MENTHOL 20-0.5 % EX AERO: 1.0000 "application " | INHALATION_SPRAY | CUTANEOUS | Status: DC | PRN

## 2021-05-14 MED ORDER — IBUPROFEN 600 MG PO TABS
600.0000 mg | ORAL_TABLET | Freq: Four times a day (QID) | ORAL | Status: DC
  Administered 2021-05-14 – 2021-05-16 (×9): 600 mg via ORAL
  Filled 2021-05-14 (×9): qty 1

## 2021-05-14 MED ORDER — SENNOSIDES-DOCUSATE SODIUM 8.6-50 MG PO TABS
2.0000 | ORAL_TABLET | Freq: Every day | ORAL | Status: DC
  Administered 2021-05-15 – 2021-05-16 (×2): 2 via ORAL
  Filled 2021-05-14 (×2): qty 2

## 2021-05-14 MED ORDER — SIMETHICONE 80 MG PO CHEW: 80.0000 mg | CHEWABLE_TABLET | ORAL | Status: DC | PRN

## 2021-05-14 MED ORDER — TERBUTALINE SULFATE 1 MG/ML IJ SOLN: 0.2500 mg | Freq: Once | INTRAMUSCULAR | Status: DC | PRN

## 2021-05-14 MED ORDER — OXYTOCIN-SODIUM CHLORIDE 30-0.9 UT/500ML-% IV SOLN
1.0000 m[IU]/min | INTRAVENOUS | Status: DC
  Administered 2021-05-14: 2 m[IU]/min via INTRAVENOUS
  Filled 2021-05-14: qty 500

## 2021-05-14 MED ORDER — ACETAMINOPHEN 325 MG PO TABS: 650.0000 mg | ORAL_TABLET | ORAL | Status: DC | PRN

## 2021-05-14 MED ORDER — WITCH HAZEL-GLYCERIN EX PADS: 1.0000 "application " | MEDICATED_PAD | CUTANEOUS | Status: DC | PRN

## 2021-05-14 MED ORDER — LACTATED RINGERS AMNIOINFUSION: INTRAVENOUS | Status: DC

## 2021-05-14 MED ORDER — DIBUCAINE (PERIANAL) 1 % EX OINT: 1.0000 "application " | TOPICAL_OINTMENT | CUTANEOUS | Status: DC | PRN

## 2021-05-14 NOTE — Progress Notes (Signed)
Dawn Blankenship is a 20 y.o. G1P0 at [redacted]w[redacted]d admitted for rupture of membranes and now with diagnosis of intrapartum gHTN  Subjective: Reports she feels comfortable with her epidural. Able to get some rest  Objective: BP (!) 108/47   Pulse (!) 56   Temp 98.4 F (36.9 C) (Oral)   Resp 16   Ht 5\' 4"  (1.626 m)   Wt 95.5 kg   LMP 08/01/2020 (Exact Date)   SpO2 98%   BMI 36.15 kg/m  I/O last 3 completed shifts: In: 279.2 [I.V.:79.2; IV Piggyback:200] Out: -  No intake/output data recorded.  FHT:  FHR: 135-140 bpm, variability: moderate,  accelerations:  Present,  decelerations:  Present intermittently UC:   regular, every 3-6 minutes SVE:   Dilation: 7.5 Effacement (%): 100 Station: -2 Exam by:: Dr. [email or web address] MD  Labs: Lab Results  Component Value Date   WBC 15.8 (H) 05/13/2021   HGB 12.6 05/13/2021   HCT 38.5 05/13/2021   MCV 86.1 05/13/2021   PLT 302 05/13/2021    Assessment / Plan:  Labor:  presented for PROM and then subsequently in active labor. Cervical dilation progressed to 7cm from 5 cm at last check with expectant management. Forebag appreciated in current check, offered to AROM and patient amenable. Forebag now AROMed with clear fluid. IUPC placed to better monitor contractions as not tracing on external monitor. Given making cervical change will hold off on pitocin augmentation at this time. Will  assess for need for pitocin if contractions spaced out on IUPC monitoring. Preeclampsia:  no signs or symptoms of toxicity, labs stable, and BP normotensive with last check  Fetal Wellbeing:  intermittently Category II with intermittent decelerations - since unable to trace contractions well prior to IUPC unable to decipher if decels late or early. Not recurrent. Has IUPC in place now and last few contractions without decels and has moderate variability in between. Pain Control:  Epidural I/D:   GBS pos, pcn allergy, clindamycin resistant, s/p Vanc dose #1    13/02/2021 05/14/2021, 1:02 AM

## 2021-05-14 NOTE — Progress Notes (Addendum)
Labor Progress Note MARK BENECKE is a 20 y.o. G1P0 at [redacted]w[redacted]d presented for SROM/labor  S:  Has epidural but feeling sharp intermittent pain in her vagina, just used button for extra dose.   O:  BP 134/70   Pulse 84   Temp 98.7 F (37.1 C) (Oral)   Resp 18   Ht 5\' 4"  (1.626 m)   Wt 95.5 kg   LMP 08/01/2020 (Exact Date)   SpO2 98%   BMI 36.15 kg/m  EFM: baseline 140 bpm/ mod variability/ + accels/ variable, early decels  Toco/IUPC: 2-4 SVE: Dilation: 9 Effacement (%): 90 Cervical Position: Middle Station: -1 Presentation: Vertex Exam by:: Janica Eldred, CNM ROT Pitocin: off  A/P: 20 y.o. G1P0 [redacted]w[redacted]d  1. Labor: active, progressing well 2. FWB: Cat II 3. Pain: epidural   IUPC out of place, stop amnioinfusion. Maternal reposition to assist fetal head rotation. Anticipate labor progress and SVD.  [redacted]w[redacted]d, CNM 8:45 AM

## 2021-05-14 NOTE — Discharge Summary (Signed)
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 yo G1P0 at 48w6das admitted in Active Labor on 05/13/2021. Patient had an uncomplicated labor course as follows:  Membrane Rupture Time/Date: 1:30 PM ,05/13/2021   Delivery Method:Vaginal, Spontaneous  Episiotomy:   Lacerations:  2nd degree;Labial  Patient had an uncomplicated postpartum course.  She is ambulating, tolerating a regular diet, passing flatus, and urinating well. Patient is discharged home in stable condition on 05/16/21.  Newborn Data: Birth date:05/14/2021  Birth time:10:09 AM  Gender:Female  Living status:Living  Apgars:8 ,9  Weight:3190 g   Magnesium Sulfate received: No BMZ received: No Rhophylac:N/A MMR:N/A T-DaP:Given prenatally Flu: No Transfusion:No  Physical exam  Vitals:   05/15/21 1140 05/15/21 1326 05/15/21 2224 05/16/21 0500  BP: 135/68 122/74 129/66 126/62  Pulse:  (!) 59 72 62  Resp:  _0 Temp:  (!) 97.5 F (36.4 C) 98.3 F (36.8 C) 98.4 F (36.9 C)  TempSrc:  Axillary Oral Oral  SpO2:  99% 99% 98%  Weight:      Height:       General: alert, cooperative, and no distress Lochia: appropriate Uterine Fundus: firm Incision:  N/A DVT Evaluation: No evidence of DVT seen on physical exam. Negative Homan's sign. No cords or calf tenderness. Calf/Ankle edema is not significantly present Labs: Lab Results  Component Value Date   WBC 15.8 (H) 05/13/2021   HGB 12.6 05/13/2021   HCT 38.5 05/13/2021   MCV 86.1 05/13/2021   PLT 302 05/13/2021   CMP Latest Ref Rng & Units 05/13/2021  Glucose 70 - 99 mg/dL 95  BUN 6 - 20 mg/dL 9  Creatinine 0.44 - 1.00 mg/dL 0.62  Sodium 135 - 145 mmol/L 135  Potassium 3.5 - 5.1 mmol/L 3.8  Chloride 98 - 111 mmol/L 103  CO2 22 - 32 mmol/L 21(L)  Calcium 8.9 - 10.3 mg/dL 9.3  Total Protein 6.5 - 8.1 g/dL 6.5  Total Bilirubin 0.3 - 1.2 mg/dL 0.4  Alkaline Phos 38 - 126 U/L 185(H)  AST 15 - 41 U/L 17  ALT 0 - 44 U/L 11   Edinburgh Score: Edinburgh Postnatal Depression Scale Screening Tool 05/15/2021  I have been able to laugh and see the funny side of things. 0  I have looked forward with enjoyment to things. 0  I have blamed myself unnecessarily when things went wrong. 1  I have been anxious or worried for no good reason. 1  I have felt scared or panicky for no good reason. 0  Things have been getting on  top of me. 1  I have been so unhappy that I have had difficulty sleeping. 0  I have felt sad or miserable. 1  I have been so unhappy that I have been crying. 0  The thought of harming myself has occurred to me. 0  Edinburgh Postnatal Depression Scale Total 4     After visit meds:  Allergies as of 05/16/2021       Reactions   Cinnamon Anaphylaxis   Penicillins Hives, Itching        Medication List     STOP taking these medications    aspirin EC 81 MG tablet   promethazine 25 MG tablet Commonly known as: PHENERGAN       TAKE these medications    acetaminophen 325 MG tablet Commonly known as: Tylenol Take 2 tablets (650 mg total) by mouth every 4 (four) hours as needed (for pain scale < 4).   benzocaine-Menthol 20-0.5 % Aero Commonly known as:  DERMOPLAST Apply 1 application topically as needed for irritation (perineal discomfort).   ibuprofen 600 MG tablet Commonly known as: ADVIL Take 1 tablet (600 mg total) by mouth every 6 (six) hours.   multivitamin-prenatal 27-0.8 MG Tabs tablet Take 1 tablet by mouth daily at 12 noon.   NIFEdipine 30 MG 24 hr tablet Commonly known as: ADALAT CC Take 1 tablet (30 mg total) by mouth daily.   senna-docusate 8.6-50 MG tablet Commonly known as: Senokot-S Take 2 tablets by mouth at bedtime as needed for mild constipation.   simethicone 80 MG chewable tablet Commonly known as: MYLICON Chew 1 tablet (80 mg total) by mouth as needed for flatulence.   witch hazel-glycerin pad Commonly known as: TUCKS Apply 1 application topically as needed for hemorrhoids.         Discharge home in stable condition Infant Feeding: Breast Infant Disposition:home with mother Discharge instruction: per After Visit Summary and Postpartum booklet. Activity: Advance as tolerated. Pelvic rest for 6 weeks.  Diet: routine diet Future Appointments: Future Appointments  Date Time Provider [place]  05/21/2021 10:30 AM [facility] NURSE [facility]  06/18/2021 10:10 AM Myrtis Ser, CNM [facility]   Follow up Visit:   Please schedule this patient for a In person postpartum visit in 6 weeks with the following provider: Any provider. Additional Postpartum F/U:BP check 1 week  High risk pregnancy complicated by: HTN Delivery mode:  Vaginal, Spontaneous SVD Anticipated Birth Control:  OCPs   05/16/2021 Araceli Bouche, MD

## 2021-05-14 NOTE — Progress Notes (Signed)
Dawn Blankenship is a 20 y.o. G1P0 at [redacted]w[redacted]d admitted for rupture of membranes and then found to have intrapartum gHTN  Recurrent late decels with every contraction starting around 0330 Pitocin turned off Terbutaline given   Contractions now spaced out and FHR back at baseline with moderate variability. Will leave pitocin off for 30 mins. Reassess at that time and if contractions spaced out will consider restarting pitocin at 59ml/hr  Dr. Jolayne Panther called and made aware and agrees with plan.

## 2021-05-15 NOTE — Anesthesia Postprocedure Evaluation (Signed)
Anesthesia Post Note  Patient: Dawn Blankenship  Procedure(s) Performed: AN AD HOC LABOR EPIDURAL     Patient location during evaluation: Mother Baby Anesthesia Type: Epidural Level of consciousness: awake, oriented and awake and alert Pain management: pain level controlled Vital Signs Assessment: post-procedure vital signs reviewed and stable Respiratory status: spontaneous breathing, respiratory function stable and nonlabored ventilation Cardiovascular status: stable Postop Assessment: no headache, adequate PO intake, able to ambulate, patient able to bend at knees and no apparent nausea or vomiting Anesthetic complications: no   No notable events documented.  Last Vitals:  Vitals:   05/15/21 0119 05/15/21 0500  BP: 133/81 137/69  Pulse: 67 70  Resp: 14 16  Temp: 36.8 C 36.6 C  SpO2: 100% 100%    Last Pain:  Vitals:   05/15/21 0520  TempSrc:   PainSc: 0-No pain   Pain Goal:                   Cacie Gaskins

## 2021-05-15 NOTE — Progress Notes (Addendum)
Patient ID: Dawn Blankenship, female   DOB: 07/02/2001, 20 y.o.   MRN: [id number]  POSTPARTUM PROGRESS NOTE  Post Partum Day 1  Subjective:  Dawn Blankenship is a 20 y.o. G1P1001 s/p vaginal delivery at [redacted]w[redacted]d.  No acute events overnight.  Pt denies problems with ambulating, voiding or po intake.  She denies nausea or vomiting.  Pain is well controlled.  She has had flatus. She has not had bowel movement.  Patient has been breastfeeding with assistance from lactation nurses and supplementing with bottle. Patient denies heavy vaginal bleeding, abdominal pain, lower extremity edema, headache, dizziness, vision changes, SOB, chest pain.   Objective: Blood pressure 137/69, pulse 70, temperature 97.9 F (36.6 C), temperature source Oral, resp. rate 16, height 5\' 4"  (1.626 m), weight 95.5 kg, last menstrual period 08/01/2020, SpO2 100 %, unknown if currently breastfeeding.  Physical Exam:  General: alert, cooperative and no distress Chest: no respiratory distress Heart:regular rate, distal pulses intact Abdomen: soft, nontender,  Uterine Fundus: firm, appropriately tender DVT Evaluation: No calf swelling or tenderness Extremities: no edema Skin: warm, dry;   Recent Labs    05/13/21 1623  HGB 12.6  HCT 38.5    Assessment/Plan: Dawn Blankenship is a 20 y.o. G1P1001 s/p vaginal delivery at [redacted]w[redacted]d   PPD#1 - Doing well  Hypertension - Procardia 30mg  XL - Will continue to monitor   Contraception: progestin-only pills Feeding: breastfeeding + bottle feeding Dispo: Plan for discharge .   LOS: 2 days   [redacted]w[redacted]d, CNM 05/15/2021, 8:48 AM    Attestation of Supervision of Student:  I confirm that I have verified the information documented in the  resident  student's note and that I have also personally reperformed the history, physical exam and all medical decision making activities.  I have verified that all services and findings are accurately  documented in this student's note; and I agree with management and plan as outlined in the documentation. I have also made any necessary editorial changes.  Zettie Pho, CNM [facility] 13/04/2021, [facility] 05/15/2021 10:30 AM

## 2021-05-16 ENCOUNTER — Ambulatory Visit: Payer: Self-pay

## 2021-05-16 ENCOUNTER — Other Ambulatory Visit (HOSPITAL_COMMUNITY): Payer: Self-pay

## 2021-05-16 MED ORDER — BENZOCAINE-MENTHOL 20-0.5 % EX AERO
1.0000 "application " | INHALATION_SPRAY | CUTANEOUS | 0 refills | Status: DC | PRN
  Filled 2021-05-16: qty 56, fill #0

## 2021-05-16 MED ORDER — SIMETHICONE 80 MG PO CHEW
80.0000 mg | CHEWABLE_TABLET | ORAL | 0 refills | Status: DC | PRN
Start: 2021-05-16 — End: 2021-06-18
  Filled 2021-05-16: qty 15, 5d supply, fill #0

## 2021-05-16 MED ORDER — NORETHINDRONE 0.35 MG PO TABS
1.0000 | ORAL_TABLET | Freq: Every day | ORAL | 11 refills | Status: DC
  Filled 2021-05-16: qty 28, 28d supply, fill #0

## 2021-05-16 MED ORDER — WITCH HAZEL-GLYCERIN EX PADS
1.0000 "application " | MEDICATED_PAD | CUTANEOUS | 12 refills | Status: DC | PRN
  Filled 2021-05-16: qty 40, 10d supply, fill #0

## 2021-05-16 MED ORDER — ACETAMINOPHEN 325 MG PO TABS
650.0000 mg | ORAL_TABLET | ORAL | 0 refills | Status: DC | PRN
  Filled 2021-05-16: qty 30, 3d supply, fill #0

## 2021-05-16 MED ORDER — SENNOSIDES-DOCUSATE SODIUM 8.6-50 MG PO TABS
2.0000 | ORAL_TABLET | Freq: Every evening | ORAL | 0 refills | Status: AC | PRN
  Filled 2021-05-16: qty 60, 30d supply, fill #0

## 2021-05-16 MED ORDER — NIFEDIPINE ER 30 MG PO TB24
30.0000 mg | ORAL_TABLET | Freq: Every day | ORAL | 0 refills | Status: DC
  Filled 2021-05-16: qty 60, 60d supply, fill #0

## 2021-05-16 MED ORDER — IBUPROFEN 600 MG PO TABS
600.0000 mg | ORAL_TABLET | Freq: Four times a day (QID) | ORAL | 0 refills | Status: DC
  Filled 2021-05-16: qty 30, 8d supply, fill #0

## 2021-05-16 NOTE — Lactation Note (Signed)
This note was copied from a baby's chart. Lactation Consultation Note  Patient Name: Dawn Blankenship Today's Date: 05/16/2021 Reason for consult: Initial assessment Age:20 hours P1, Mother request to see LC . She had been attempting to breastfeed and bottle feed. Mother has semi-flat nipples with areola edema today.  She expresses that she would like assistance with breast breast.  Assist mother with reverse pressure . Mother was fit with a #24 NS. Infant latched on with NS. Infant sustained latch for 20 mins. Infant was given 5 ml of formula through the shield.  Mother to continue to supplement infant after feeding. Mother was given a hand pump and advised to  pump after each feeding.  Discussed treatment and prevention of engorgement.   Mother request follow up with LC . Message sent to Schedule with OP LC. Maternal Data    Feeding Mother's Current Feeding Choice: Breast Milk and Formula  LATCH Score Latch: Repeated attempts needed to sustain latch, nipple held in mouth throughout feeding, stimulation needed to elicit sucking reflex.  Audible Swallowing: Spontaneous and intermittent  Type of Nipple: Flat  Comfort (Breast/Nipple): Filling, red/small blisters or bruises, mild/mod discomfort  Hold (Positioning): Assistance needed to correctly position infant at breast and maintain latch.  LATCH Score: 6   Lactation Tools Discussed/Used Tools: Nipple Shields Nipple shield size: 24 Flange Size: 21 Breast pump type: Manual Pump Education: Setup, frequency, and cleaning;Milk Storage Reason for Pumping: lactation induction Pumping frequency: every 3 hours for 15 mins on each breast  Interventions Interventions: Breast feeding basics reviewed;Assisted with latch;Skin to skin;Hand express;Reverse pressure;Breast compression;Adjust position;Support pillows;Position options;Expressed milk;Hand pump  Discharge Discharge Education: Engorgement and breast care;Warning signs  for feeding baby;Outpatient recommendation;Outpatient Epic message sent  Consult Status Consult Status: Complete    Michel Bickers 05/16/2021, 12:46 PM

## 2021-05-17 ENCOUNTER — Encounter (HOSPITAL_COMMUNITY): Payer: Medicaid Other

## 2021-05-17 ENCOUNTER — Inpatient Hospital Stay (HOSPITAL_COMMUNITY)
Admission: AD | Admit: 2021-05-17 | Payer: Medicaid Other | Source: Home / Self Care | Admitting: Obstetrics and Gynecology

## 2021-05-17 ENCOUNTER — Inpatient Hospital Stay (HOSPITAL_COMMUNITY): Payer: Medicaid Other

## 2021-05-21 ENCOUNTER — Other Ambulatory Visit: Payer: Self-pay

## 2021-05-21 ENCOUNTER — Encounter: Payer: Self-pay | Admitting: *Deleted

## 2021-05-21 ENCOUNTER — Ambulatory Visit (INDEPENDENT_AMBULATORY_CARE_PROVIDER_SITE_OTHER): Payer: Medicaid Other | Admitting: *Deleted

## 2021-05-21 VITALS — BP 119/70 | HR 80

## 2021-05-21 DIAGNOSIS — Z0130 Encounter for examination of blood pressure without abnormal findings: Secondary | ICD-10-CM

## 2021-05-21 NOTE — Progress Notes (Addendum)
   NURSE VISIT- BLOOD PRESSURE CHECK  SUBJECTIVE:  Dawn Blankenship is a 20 y.o. G4P1001 female here for BP check. She is postpartum, delivery date 05/14/21.     HYPERTENSION ROS:  Pregnant/postpartum:  Severe headaches that don't go away with tylenol/other medicines: No  Visual changes (seeing spots/double/blurred vision) No  Severe pain under right breast breast or in center of upper chest No  Severe nausea/vomiting No  Taking medicines as instructed yes    OBJECTIVE:  LMP 08/01/2020 (Exact Date)   Breastfeeding Yes   Appearance alert, well appearing, and in no distress.  ASSESSMENT: Postpartum  blood pressure check  PLAN: Discussed with Dawn Blankenship, CNM   Recommendations:  stop BP med.    Follow-up: as scheduled   Dawn Blankenship  05/21/2021 11:02 AM   Chart reviewed for nurse visit. Agree with plan of care.  Dawn Blankenship, [place] 05/21/2021 12:36 PM

## 2021-05-27 ENCOUNTER — Telehealth (HOSPITAL_COMMUNITY): Payer: Self-pay | Admitting: *Deleted

## 2021-05-27 NOTE — Telephone Encounter (Signed)
Attempted hospital discharge follow-up call. Left message for patient to return RN call. Deforest Hoyles, RN, 05/27/21, [phone]

## 2021-06-18 ENCOUNTER — Other Ambulatory Visit: Payer: Self-pay

## 2021-06-18 ENCOUNTER — Encounter: Payer: Self-pay | Admitting: Advanced Practice Midwife

## 2021-06-18 ENCOUNTER — Ambulatory Visit (INDEPENDENT_AMBULATORY_CARE_PROVIDER_SITE_OTHER): Payer: Medicaid Other | Admitting: Advanced Practice Midwife

## 2021-06-18 MED ORDER — NORETHIN ACE-ETH ESTRAD-FE 1-20 MG-MCG PO TABS: 1.0000 | ORAL_TABLET | Freq: Every day | ORAL | 11 refills | Status: DC

## 2021-06-18 NOTE — Progress Notes (Signed)
POSTPARTUM VISIT Patient name: Dawn Blankenship MRN [id number]  Date of birth: 2001/04/06 Chief Complaint:   Postpartum Care  History of Present Illness:   Dawn Blankenship is a 20 y.o. G70P1001 Caucasian female being seen today for a postpartum visit. She is 5 weeks postpartum following a spontaneous vaginal delivery at 40.6 gestational weeks. IOL: no  Anesthesia: local and epidural.  Laceration: 2nd deg vag and R labial.  Complications: none. Inpatient contraception: yes , given rx for POPs which she started and finished her first pack recently .   Pregnancy uncomplicated. Tobacco use: former . Substance use disorder: no. Last pap smear: <21yo  Next pap smear due: Oct 2023 No LMP recorded. (Menstrual status: Oral contraceptives).  Postpartum course has been complicated by being started on Procardia PP that she stayed on x approx 1wk . Bleeding scant staining. Bowel function is normal. Bladder function is normal. Urinary incontinence? no, fecal incontinence? no Patient is not sexually active. Last sexual activity: prior to birth of baby. Desired contraception:  wants to change from POPs to OCPs . Patient does want a pregnancy in the future.  Desired family size is 2 children.   Upstream - 06/18/21 1006       Pregnancy Intention Screening   Does the patient want to become pregnant in the next year? Unsure    Does the patient's partner want to become pregnant in the next year? Unsure    Would the patient like to discuss contraceptive options today? No      Contraception Wrap Up   Current Method Oral Contraceptive    End Method Oral Contraceptive    Contraception Counseling Provided No            The pregnancy intention screening data noted above was reviewed. Potential methods of contraception were discussed. The patient elected to proceed with Oral Contraceptive.  Edinburgh Postpartum Depression Screening: neg  Edinburgh Postnatal Depression Scale - 06/18/21 1007        Edinburgh Postnatal Depression Scale:  In the Past 7 Days   I have been able to laugh and see the funny side of things. 0    I have looked forward with enjoyment to things. 0    I have blamed myself unnecessarily when things went wrong. 1    I have been anxious or worried for no good reason. 2    I have felt scared or panicky for no good reason. 1    Things have been getting on top of me. 1    I have been so unhappy that I have had difficulty sleeping. 0    I have felt sad or miserable. 0    I have been so unhappy that I have been crying. 0    The thought of harming myself has occurred to me. 0    Edinburgh Postnatal Depression Scale Total 5             GAD 7 : Generalized Anxiety Score 02/24/2021 10/23/2020 09/13/2020  Nervous, Anxious, on Edge 1 0 1  Control/stop worrying 0 1 1  Worry too much - different things 0 1 1  Trouble relaxing 1 1 0  Restless 0 0 1  Easily annoyed or irritable 1 1 1   Afraid - awful might happen 0 0 0  Total GAD 7 Score 3 4 5      Baby's course has been uncomplicated. Baby is feeding by bottle. Infant has a pediatrician/family doctor? Yes.  Childcare strategy if returning  to work/school: family.  Pt has material needs met for her and baby: Yes.   Review of Systems:   Pertinent items are noted in HPI Denies Abnormal vaginal discharge w/ itching/odor/irritation, headaches, visual changes, shortness of breath, chest pain, abdominal pain, severe nausea/vomiting, or problems with urination or bowel movements. Pertinent History Reviewed:  Reviewed past medical,surgical, obstetrical and family history.  Reviewed problem list, medications and allergies. OB History  Gravida Para Term Preterm AB Living  1 1 1     1   SAB IAB Ectopic Multiple Live Births        0 1    # Outcome Date GA Lbr Len/2nd Weight Sex Delivery Anes PTL Lv  1 Term 05/14/21 [redacted]w[redacted]d/ 00:22 3.19 kg F Vag-Spont EPI, Local  LIV   Physical Assessment:   Vitals:   06/18/21 1006  BP: 123/77   Pulse: (!) 104  Weight: 91.2 kg  Height: 5' 4"  (1.626 m)  Body mass index is 34.5 kg/m.       Physical Examination:   General appearance: alert, well appearing, and in no distress  Mental status: alert, oriented to person, place, and time  Skin: warm & dry   Cardiovascular: normal heart rate noted   Respiratory: normal respiratory effort, no distress   Breasts: deferred, no complaints   Abdomen: soft, non-tender   Pelvic: normal external genitalia, vulva, vagina, cervix, uterus and adnexa. Thin prep pap obtained: No  Rectal: not examined  Extremities: Edema: none         No results found for this or any previous visit (from the past 24 hour(s)).  Assessment & Plan:  1) Postpartum exam 2) Five wks s/p spontaneous vaginal delivery 3) bottle feeding 4) Depression screening 5) Contraception management: rx Junel to start today; use backup method x 1 month  Essential components of care per ACOG recommendations:  1.  Mood and well being:  If positive depression screen, discussed and plan developed.  If using tobacco we discussed reduction/cessation and risk of relapse If current substance abuse, we discussed and referral to local resources was offered.   2. Infant care and feeding:  If breastfeeding, discussed returning to work, pumping, breastfeeding-associated pain, guidance regarding return to fertility while lactating if not using another method. If needed, patient was provided with a letter to be allowed to pump q 2-3hrs to support lactation in a private location with access to a refrigerator to store breastmilk.   Recommended that all caregivers be immunized for flu, pertussis and other preventable communicable diseases If pt does not have material needs met for her/baby, referred to local resources for help obtaining these.  3. Sexuality, contraception and birth spacing Provided guidance regarding sexuality, management of dyspareunia, and resumption of intercourse Discussed  avoiding interpregnancy interval <614ms and recommended birth spacing of 18 months  4. Sleep and fatigue Discussed coping options for fatigue and sleep disruption Encouraged family/partner/community support of 4 hrs of uninterrupted sleep to help with mood and fatigue  5. Physical recovery  If pt had a C/S, assessed incisional pain and providing guidance on normal vs prolonged recovery If pt had a laceration, perineal healing and pain reviewed.  If urinary or fecal incontinence, discussed management and referred to PT or uro/gyn if indicated  Patient is safe to resume physical activity. Discussed attainment of healthy weight.  6.  Chronic disease management Discussed pregnancy complications if any, and their implications for future childbearing and long-term maternal health. Review recommendations for prevention of recurrent pregnancy  complications, such as 17 hydroxyprogesterone caproate to reduce risk for recurrent PTB not applicable, or aspirin to reduce risk of preeclampsia: yes. Pt had GDM: no. If yes, 2hr GTT scheduled: not applicable. Reviewed medications and non-pregnant dosing including consideration of whether pt is breastfeeding using a reliable resource such as LactMed: not applicable Referred for f/u w/ PCP or subspecialist providers as indicated: not applicable  7. Health maintenance Mammogram at 20yo or earlier if indicated Pap smears as indicated  Meds:  Meds ordered this encounter  Medications   norethindrone-ethinyl estradiol-FE (JUNEL FE 1/20) 1-20 MG-MCG tablet    Sig: Take 1 tablet by mouth daily.    Dispense:  28 tablet    Refill:  11    Order Specific Question:   Supervising Provider    Answer:   Janyth Pupa [[id number]]    Follow-up: Return in about 1 year (around 06/18/2022) for Pap & Physical.   No orders of the defined types were placed in this encounter.   Myrtis Ser CNM 06/19/2021 8:36 AM

## 2022-06-08 ENCOUNTER — Ambulatory Visit (INDEPENDENT_AMBULATORY_CARE_PROVIDER_SITE_OTHER): Payer: Medicaid Other | Admitting: *Deleted

## 2022-06-08 ENCOUNTER — Encounter: Payer: Self-pay | Admitting: *Deleted

## 2022-06-08 VITALS — BP 111/70 | HR 92 | Ht 64.0 in | Wt 210.0 lb

## 2022-06-08 DIAGNOSIS — Z3201 Encounter for pregnancy test, result positive: Secondary | ICD-10-CM | POA: Diagnosis not present

## 2022-06-08 LAB — POCT URINE PREGNANCY: Preg Test, Ur: POSITIVE — AB

## 2022-06-08 NOTE — Progress Notes (Cosign Needed)
   NURSE VISIT- PREGNANCY CONFIRMATION   SUBJECTIVE:  Dawn Blankenship is a 21 y.o. G69P1001 female at [redacted]w[redacted]d by certain LMP of Patient's last menstrual period was 04/23/2022. Here for pregnancy confirmation.  Home pregnancy test: positive x 3   She reports no complaints.  She is taking prenatal vitamins.    OBJECTIVE:  BP 111/70 (BP Location: Left Arm, Patient Position: Sitting, Cuff Size: Normal)   Pulse 92   Ht 5\' 4"  (1.626 m)   Wt 210 lb (95.3 kg)   LMP 04/23/2022   Breastfeeding No   BMI 36.05 kg/m   Appears well, in no apparent distress  Results for orders placed or performed in visit on 06/08/22 (from the past 24 hour(s))  POCT urine pregnancy   Collection Time: 06/08/22  2:54 PM  Result Value Ref Range   Preg Test, Ur Positive (A) Negative    ASSESSMENT: Positive pregnancy test, [redacted]w[redacted]d by LMP    PLAN: Schedule for dating ultrasound in 3 weeks Prenatal vitamins: continue   Nausea medicines: not currently needed   OB packet given: Yes  [redacted]w[redacted]d  06/08/2022 2:58 PM  Chart reviewed for nurse visit. Agree with plan of care.  14/10/2021, Cheral Marker 06/09/2022 11:09 AM

## 2022-06-17 ENCOUNTER — Encounter: Payer: Self-pay | Admitting: *Deleted

## 2022-06-17 ENCOUNTER — Other Ambulatory Visit: Payer: Self-pay | Admitting: Obstetrics & Gynecology

## 2022-06-17 DIAGNOSIS — O3680X0 Pregnancy with inconclusive fetal viability, not applicable or unspecified: Secondary | ICD-10-CM

## 2022-06-17 DIAGNOSIS — O139 Gestational [pregnancy-induced] hypertension without significant proteinuria, unspecified trimester: Secondary | ICD-10-CM | POA: Insufficient documentation

## 2022-06-17 DIAGNOSIS — Z3490 Encounter for supervision of normal pregnancy, unspecified, unspecified trimester: Secondary | ICD-10-CM | POA: Insufficient documentation

## 2022-06-17 DIAGNOSIS — Z8759 Personal history of other complications of pregnancy, childbirth and the puerperium: Secondary | ICD-10-CM | POA: Insufficient documentation

## 2022-06-19 ENCOUNTER — Ambulatory Visit (INDEPENDENT_AMBULATORY_CARE_PROVIDER_SITE_OTHER): Payer: Medicaid Other | Admitting: *Deleted

## 2022-06-19 ENCOUNTER — Ambulatory Visit (INDEPENDENT_AMBULATORY_CARE_PROVIDER_SITE_OTHER): Payer: Medicaid Other

## 2022-06-19 ENCOUNTER — Encounter: Payer: Self-pay | Admitting: *Deleted

## 2022-06-19 DIAGNOSIS — O3680X0 Pregnancy with inconclusive fetal viability, not applicable or unspecified: Secondary | ICD-10-CM | POA: Diagnosis not present

## 2022-06-19 DIAGNOSIS — Z8759 Personal history of other complications of pregnancy, childbirth and the puerperium: Secondary | ICD-10-CM

## 2022-06-19 DIAGNOSIS — Z3A08 8 weeks gestation of pregnancy: Secondary | ICD-10-CM

## 2022-06-19 DIAGNOSIS — Z3480 Encounter for supervision of other normal pregnancy, unspecified trimester: Secondary | ICD-10-CM

## 2022-06-19 NOTE — Progress Notes (Unsigned)
   INITIAL OB NURSE INTAKE  SUBJECTIVE:  Dawn Blankenship is a 21 y.o. G2P1001 female [redacted]w[redacted]d by LMP c/w today's U/S with an Estimated Date of Delivery: 01/28/23 being seen today for her initial OB intake/educational visit with RN. She is taking prenatal vitamins.  She is not having nausea and/or vomiting and does not request nausea meds at this time.  Patient's last menstrual period was 04/23/2022.  Patient's medical, surgical, and obstetrical history obtained and reviewed.  Current medications reviewed.   Patient Active Problem List   Diagnosis Date Noted   History of gestational hypertension 06/17/2022   Encounter for supervision of normal pregnancy, antepartum 06/17/2022   Past Medical History:  Diagnosis Date   Gestational hypertension    Past Surgical History:  Procedure Laterality Date   NO PAST SURGERIES     OB History     Gravida  2   Para  1   Term  1   Preterm      AB      Living  1      SAB      IAB      Ectopic      Multiple  0   Live Births  1           OBJECTIVE:  LMP 04/23/2022   ASSESSMENT/PLAN: G2P1001 at [redacted]w[redacted]d with an Estimated Date of Delivery: 01/28/23  Prenatal vitamins: continue   Nausea medicines: not currently needed   OB packet given: Yes BabyScripts and MyChart activated  Blood Pressure Cuff: has at home. Discussed to be used for virtual visits and home BP checks.  Genetic & carrier screening discussed: requests Panorama and NT/IT, Placed OB Box on problem list and updated Reviewed recommended weight gain based on pre-gravid BMI {Blank single:19197::"BMI <18.5, gain max 28-40lb","BMI 18.5-24.9, gain max 25-35lb","BMI 25-29.9, gain max 15-25lb","BMI >=30, gain max 11-20lb"}  Follow-up in {1-10,15,20,30:13553} {Time; days - HALPF:79024} for {Blank single:19197::"NT U/S & New OB visit with provider","New OB visit with provider","***"}  Face-to-face time at least 30 minutes. 50% or more of this visit was spent in counseling  and coordination of care.  Debbe Odea Valli Randol RN-C 06/19/2022 10:43 AM

## 2022-06-19 NOTE — Progress Notes (Unsigned)
   INITIAL OB NURSE INTAKE  SUBJECTIVE:  Dawn Blankenship is a 21 y.o. G2P1001 female [redacted]w[redacted]d by LMP c/w today's U/S with an Estimated Date of Delivery: 01/28/23 being seen today for her initial OB intake/educational visit with RN. She is taking prenatal vitamins.  She is not having nausea and/or vomiting and does not request nausea meds at this time.  Patient's last menstrual period was 04/23/2022.  Patient's medical, surgical, and obstetrical history obtained and reviewed.  Current medications reviewed.   Patient Active Problem List   Diagnosis Date Noted   History of gestational hypertension 06/17/2022   Encounter for supervision of normal pregnancy, antepartum 06/17/2022   Past Medical History:  Diagnosis Date   Gestational hypertension    Past Surgical History:  Procedure Laterality Date   NO PAST SURGERIES     OB History     Gravida  2   Para  1   Term  1   Preterm      AB      Living  1      SAB      IAB      Ectopic      Multiple  0   Live Births  1           OBJECTIVE:  LMP 04/23/2022   ASSESSMENT/PLAN: G2P1001 at [redacted]w[redacted]d with an Estimated Date of Delivery: 01/28/23  Prenatal vitamins: continue   Nausea medicines: not currently needed   OB packet given: Yes BabyScripts and MyChart activated  Blood Pressure Cuff: has at home. Discussed to be used for virtual visits and home BP checks.  Genetic & carrier screening discussed: requests Panorama and NT/IT, Placed OB Box on problem list and updated Reviewed recommended weight gain based on pre-gravid BMI BMI >=30, gain max 11-20lb  Follow-up in 4 weeks for NT U/S & New OB visit with provider  Face-to-face time at least 30 minutes. 50% or more of this visit was spent in counseling and coordination of care.  Debbe Odea Rodel Glaspy RN-C 06/19/2022 12:11 PM

## 2022-06-19 NOTE — Progress Notes (Signed)
[place] 8+1 wks,single IUP with YS,CRL 18.17 mm,FHR 171 bpm,normal ovaries,subchorionic hemorrhage 2.3 x 1.9 x .6 cm

## 2022-07-06 NOTE — L&D Delivery Note (Signed)
Delivery Note Dawn Blankenship is a 22 y.o. G2P1001 at [redacted]w[redacted]d admitted for ROM/labor.   GBS Status:  --/Positive (07/05 1400)  Labor course: Initial SVE: 5/80/-2. Augmentation with: nothing. She then progressed to complete. She did not received adequate GBS prophylaxis. ROM: 4h 85m with clear fluid  Birth: After a 5 contraction 2nd stage, she delivered a Live born female  Birth Weight:   APGAR: 8, 9  Newborn Delivery   Birth date/time: 01/[birth year]2023 00:05:16 Delivery type: Vaginal, Spontaneous        Delivered via spontaneous vaginal delivery (Presentation: LOA ). Nuchal cord present: No. . Shoulders and body delivered in usual fashion. Infant placed directly on mom's abdomen for bonding/skin-to-skin, baby dried and stimulated. Cord clamped x 2 after 1 minute and cut by FOB.  Cord blood collected. Placenta delivered-Spontaneous with 3 vessels. 20u Pitocin in 500cc LR given as a bolus prior to delivery of placenta.  Fundus firm with massage. Placenta inspected and appears to be intact with a 3 VC.  Sponge and instrument count were correct x2.  Intrapartum complications:  None Anesthesia:  local Lacerations:  2nd degree Suture Repair: 2.0 vicryl EBL (mL):122.00    Mom to postpartum.  Baby to Couplet care / Skin to Skin. Placenta to L&D   Plans to Bottlefeed Contraception: IUD Circumcision: wants inpatient  Note sent to [facility]: FT for pp visit.  Delivery Report:   Review the Delivery Report for details.     Signed: Jacklyn Shell, DNP,CNM 01/29/2023, 12:44 AM

## 2022-07-21 ENCOUNTER — Other Ambulatory Visit: Payer: Self-pay | Admitting: Obstetrics & Gynecology

## 2022-07-21 DIAGNOSIS — Z3682 Encounter for antenatal screening for nuchal translucency: Secondary | ICD-10-CM

## 2022-07-22 ENCOUNTER — Ambulatory Visit (INDEPENDENT_AMBULATORY_CARE_PROVIDER_SITE_OTHER): Payer: Medicaid Other | Admitting: Advanced Practice Midwife

## 2022-07-22 ENCOUNTER — Encounter: Payer: Self-pay | Admitting: Advanced Practice Midwife

## 2022-07-22 ENCOUNTER — Ambulatory Visit (INDEPENDENT_AMBULATORY_CARE_PROVIDER_SITE_OTHER): Payer: Medicaid Other

## 2022-07-22 ENCOUNTER — Other Ambulatory Visit (HOSPITAL_COMMUNITY)
Admission: RE | Admit: 2022-07-22 | Discharge: 2022-07-22 | Disposition: A | Discharge status: Home / Self Care | Payer: Medicaid Other | Source: Ambulatory Visit | Attending: Advanced Practice Midwife | Admitting: Advanced Practice Midwife

## 2022-07-22 VITALS — BP 121/65 | HR 65 | Wt 202.0 lb

## 2022-07-22 DIAGNOSIS — Z3A12 12 weeks gestation of pregnancy: Secondary | ICD-10-CM

## 2022-07-22 DIAGNOSIS — Z3481 Encounter for supervision of other normal pregnancy, first trimester: Secondary | ICD-10-CM

## 2022-07-22 DIAGNOSIS — Z113 Encounter for screening for infections with a predominantly sexual mode of transmission: Secondary | ICD-10-CM

## 2022-07-22 DIAGNOSIS — Z8759 Personal history of other complications of pregnancy, childbirth and the puerperium: Secondary | ICD-10-CM

## 2022-07-22 DIAGNOSIS — Z3480 Encounter for supervision of other normal pregnancy, unspecified trimester: Secondary | ICD-10-CM

## 2022-07-22 DIAGNOSIS — Z3682 Encounter for antenatal screening for nuchal translucency: Secondary | ICD-10-CM | POA: Diagnosis not present

## 2022-07-22 DIAGNOSIS — Z124 Encounter for screening for malignant neoplasm of cervix: Secondary | ICD-10-CM

## 2022-07-22 DIAGNOSIS — Z363 Encounter for antenatal screening for malformations: Secondary | ICD-10-CM

## 2022-07-22 DIAGNOSIS — Z1379 Encounter for other screening for genetic and chromosomal anomalies: Secondary | ICD-10-CM

## 2022-07-22 MED ORDER — ASPIRIN 81 MG PO CHEW: 162.0000 mg | CHEWABLE_TABLET | Freq: Every day | ORAL | 7 refills | Status: AC

## 2022-07-22 NOTE — Progress Notes (Signed)
INITIAL OBSTETRICAL VISIT Patient name: Dawn Blankenship MRN [id number]  Date of birth: [birth year] Chief Complaint:   Initial Prenatal Visit  History of Present Illness:   Dawn Blankenship is a 22 y.o. G56P1001 Caucasian female at [redacted]w[redacted]d by LMP c/w u/s at 8.2 weeks with an Estimated Date of Delivery: 01/28/23 being seen today for her initial obstetrical visit.   Patient's last menstrual period was 04/23/2022. Her obstetrical history is significant for  SVB 2022 w gHTN .   Today she reports no complaints.  Last pap never. Results were: N/A     07/22/2022   11:54 AM 02/24/2021    9:12 AM 10/23/2020   11:05 AM 09/13/2020   11:00 AM  Depression screen PHQ 2/9  Decreased Interest 0 0 0 1  Down, Depressed, Hopeless 0 0 1 0  PHQ - 2 Score 0 0 1 1  Altered sleeping 1 1 1 1   Tired, decreased energy 1 0 1 1  Change in appetite 0 0 0 0  Feeling bad or failure about yourself  0 0 1 0  Trouble concentrating 0 0 1 1  Moving slowly or fidgety/restless 0 0 0 0  Suicidal thoughts 0 0 0 0  PHQ-9 Score 2 1 5 4         07/22/2022   11:54 AM 02/24/2021    9:12 AM 10/23/2020   11:05 AM 09/13/2020   11:10 AM  GAD 7 : Generalized Anxiety Score  Nervous, Anxious, on Edge 1 1 0 1  Control/stop worrying 0 0 1 1  Worry too much - different things 0 0 1 1  Trouble relaxing 0 1 1 0  Restless 0 0 0 1  Easily annoyed or irritable 1 1 1 1   Afraid - awful might happen 0 0 0 0  Total GAD 7 Score 2 3 4 5      Review of Systems:   Pertinent items are noted in HPI Denies cramping/contractions, leakage of fluid, vaginal bleeding, abnormal vaginal discharge w/ itching/odor/irritation, headaches, visual changes, shortness of breath, chest pain, abdominal pain, severe nausea/vomiting, or problems with urination or bowel movements unless otherwise stated above.  Pertinent History Reviewed:  Reviewed past medical,surgical, social, obstetrical and family history.  Reviewed problem list, medications and  allergies. OB History  Gravida Para Term Preterm AB Living  2 1 1     1   SAB IAB Ectopic Multiple Live Births        0 1    # Outcome Date GA Lbr Len/2nd Weight Sex Delivery Anes PTL Lv  2 Current           1 Term 05/14/21 [redacted]w[redacted]d / 00:22 7 lb 0.5 oz (3.19 kg) F Vag-Spont EPI, Local N LIV     Complications: Gestational hypertension   Physical Assessment:   Vitals:   07/22/22 1423  BP: 121/65  Pulse: 65  Weight: 202 lb (91.6 kg)  Body mass index is 34.67 kg/m.       Physical Examination:  General appearance - well appearing, and in no distress  Mental status - alert, oriented to person, place, and time  Psych:  She has a normal mood and affect  Skin - warm and dry, normal color, no suspicious lesions noted  Chest - effort normal, all lung fields clear to auscultation bilaterally  Heart - normal rate and regular rhythm  Abdomen - soft, nontender  Extremities:  No swelling or varicosities noted  Pelvic - VULVA: normal appearing vulva  with no masses, tenderness or lesions  VAGINA: normal appearing vagina with normal color and discharge, no lesions  CERVIX: normal appearing cervix without discharge or lesions, no CMT  Thin prep pap is done without HR HPV cotesting  Chaperone: Celene Squibb    TODAY'S NT [place] 12+6 wks,measurements c/w dates,FHR 160 bpm,posterior placenta,normal ovaries,2.4 x 1.9 x 1.5 cm simple left adnexal cyst adjacent to left ovary,NB present,NT 1.6 mm   No results found for this or any previous visit (from the past 24 hour(s)).  Assessment & Plan:  1) Low-Risk Pregnancy G2P1001 at [redacted]w[redacted]d with an Estimated Date of Delivery: 01/28/23   2) Initial OB visit  3) Hx gHTN, rx bASA 162mg /day; baseline labs collected  Meds:  Meds ordered this encounter  Medications   aspirin 81 MG chewable tablet    Sig: Chew 2 tablets (162 mg total) by mouth daily.    Dispense:  60 tablet    Refill:  7    Order Specific Question:   Supervising Provider    Answer:   Janyth Pupa [[id number]]    Initial labs obtained Continue prenatal vitamins Reviewed n/v relief measures and warning s/s to report Reviewed recommended weight gain based on pre-gravid BMI Encouraged well-balanced diet Genetic & carrier screening discussed: requests Panorama and NT/IT, declines Horizon  (neg April 2022) Ultrasound discussed; fetal survey: requested [place] completed> form faxed if has or is planning to apply for medicaid The nature of [place] for [employer] with multiple MDs and other Advanced Practice Providers was explained to patient; also emphasized that fellows, residents, and students are part of our team. Does have home bp cuff. Office bp cuff given: no. Rx sent: n/a. Check bp weekly, let us know if consistently >140/90.   Indications for ASA therapy (per uptodate) One of the following: H/O preeclampsia, especially early onset/adverse outcome Yes  No indications for early A1C (per uptodate)  Follow-up: Return for 4wk LROB/2nd IT; 7wk LROB and anatomy u/s.   Orders Placed This Encounter  Procedures   Urine Culture   US OB Comp + 14 Wk   CBC/D/Plt+RPR+Rh+ABO+RubIgG...   Comp Met (CMET)   Protein / creatinine ratio, urine   Panorama Prenatal Test Full Panel   Integrated 1    Myrtis Ser [facility] 07/22/2022 3:15 PM

## 2022-07-22 NOTE — Progress Notes (Signed)
[place] 12+6 wks,measurements c/w dates,FHR 160 bpm,posterior placenta,normal ovaries,2.4 x 1.9 x 1.5 cm simple left adnexal cyst adjacent to left ovary,NB present,NT 1.6 mm

## 2022-07-23 LAB — INTEGRATED 1

## 2022-07-24 LAB — URINE CULTURE

## 2022-07-24 LAB — CYTOLOGY - PAP
Adequacy: ABSENT
Chlamydia: NEGATIVE
Comment: NEGATIVE
Comment: NORMAL
Diagnosis: NEGATIVE
Neisseria Gonorrhea: NEGATIVE

## 2022-07-25 LAB — CBC/D/PLT+RPR+RH+ABO+RUBIGG...
Antibody Screen: NEGATIVE
Basophils Absolute: 0.1 10*3/uL (ref 0.0–0.2)
Basos: 1 %
EOS (ABSOLUTE): 0.1 10*3/uL (ref 0.0–0.4)
Eos: 1 %
HCV Ab: NONREACTIVE
HIV Screen 4th Generation wRfx: NONREACTIVE
Hematocrit: 37 % (ref 34.0–46.6)
Hemoglobin: 11.8 g/dL (ref 11.1–15.9)
Hepatitis B Surface Ag: NEGATIVE
Immature Grans (Abs): 0 10*3/uL (ref 0.0–0.1)
Immature Granulocytes: 0 %
Lymphocytes Absolute: 3.4 10*3/uL — ABNORMAL HIGH (ref 0.7–3.1)
Lymphs: 32 %
MCH: 27.4 pg (ref 26.6–33.0)
MCHC: 31.9 g/dL (ref 31.5–35.7)
MCV: 86 fL (ref 79–97)
Monocytes Absolute: 0.6 10*3/uL (ref 0.1–0.9)
Monocytes: 5 %
Neutrophils Absolute: 6.6 10*3/uL (ref 1.4–7.0)
Neutrophils: 61 %
Platelets: 321 10*3/uL (ref 150–450)
RBC: 4.31 x10E6/uL (ref 3.77–5.28)
RDW: 13 % (ref 11.7–15.4)
RPR Ser Ql: NONREACTIVE
Rh Factor: POSITIVE
Rubella Antibodies, IGG: 3.67 index (ref >0.99)
WBC: 10.8 10*3/uL (ref 3.4–10.8)

## 2022-07-25 LAB — COMPREHENSIVE METABOLIC PANEL
ALT: 11 IU/L (ref 0–32)
AST: 9 IU/L (ref 0–40)
Albumin/Globulin Ratio: 2 (ref 1.2–2.2)
Albumin: 4.5 g/dL (ref 4.0–5.0)
Alkaline Phosphatase: 67 IU/L (ref 44–121)
BUN/Creatinine Ratio: 8 — ABNORMAL LOW (ref 9–23)
BUN: 4 mg/dL — ABNORMAL LOW (ref 6–20)
Bilirubin Total: <0.2 mg/dL (ref 0.0–1.2)
CO2: 19 mmol/L — ABNORMAL LOW (ref 20–29)
Calcium: 9.7 mg/dL (ref 8.7–10.2)
Chloride: 104 mmol/L (ref 96–106)
Creatinine, Ser: 0.49 mg/dL — ABNORMAL LOW (ref 0.57–1.00)
Globulin, Total: 2.3 g/dL (ref 1.5–4.5)
Glucose: 85 mg/dL (ref 70–99)
Potassium: 4.4 mmol/L (ref 3.5–5.2)
Sodium: 139 mmol/L (ref 134–144)
Total Protein: 6.8 g/dL (ref 6.0–8.5)
eGFR: 137 mL/min/{1.73_m2} (ref >59)

## 2022-07-25 LAB — INTEGRATED 1
Crown Rump Length: 69.5 mm
Gest. Age on Collection Date: 13 weeks
Maternal Age at EDD: 21.8 yr
Nuchal Translucency (NT): 1.6 mm
Number of Fetuses: 1
PAPP-A Value: 632.1 ng/mL
Weight: 202 [lb_av]

## 2022-07-25 LAB — PROTEIN / CREATININE RATIO, URINE

## 2022-07-25 LAB — HCV INTERPRETATION

## 2022-07-25 LAB — SPECIMEN STATUS REPORT

## 2022-07-31 LAB — PANORAMA PRENATAL TEST FULL PANEL:PANORAMA TEST PLUS 5 ADDITIONAL MICRODELETIONS: FETAL FRACTION: 7.3

## 2022-08-19 ENCOUNTER — Encounter: Payer: Self-pay | Admitting: Advanced Practice Midwife

## 2022-08-19 ENCOUNTER — Ambulatory Visit (INDEPENDENT_AMBULATORY_CARE_PROVIDER_SITE_OTHER): Payer: Medicaid Other | Admitting: Advanced Practice Midwife

## 2022-08-19 VITALS — BP 135/67 | HR 89 | Wt 203.0 lb

## 2022-08-19 DIAGNOSIS — Z3480 Encounter for supervision of other normal pregnancy, unspecified trimester: Secondary | ICD-10-CM

## 2022-08-19 DIAGNOSIS — Z3A16 16 weeks gestation of pregnancy: Secondary | ICD-10-CM

## 2022-08-19 DIAGNOSIS — Z3482 Encounter for supervision of other normal pregnancy, second trimester: Secondary | ICD-10-CM

## 2022-08-19 NOTE — Progress Notes (Signed)
   LOW-RISK PREGNANCY VISIT Patient name: Dawn Blankenship MRN [id number]  Date of birth: 02-Apr-2001 Chief Complaint:   Routine Prenatal Visit  History of Present Illness:   Dawn Blankenship is a 22 y.o. G108P1001 female at [redacted]w[redacted]d with an Estimated Date of Delivery: 01/28/23 being seen today for ongoing management of a low-risk pregnancy.  Today she reports  constipation . Contractions: Not present. Vag. Bleeding: None.   . denies leaking of fluid. Review of Systems:   Pertinent items are noted in HPI Denies abnormal vaginal discharge w/ itching/odor/irritation, headaches, visual changes, shortness of breath, chest pain, abdominal pain, severe nausea/vomiting, or problems with urination or bowel movements unless otherwise stated above. Pertinent History Reviewed:  Reviewed past medical,surgical, social, obstetrical and family history.  Reviewed problem list, medications and allergies. Physical Assessment:   Vitals:   08/19/22 1534  BP: 135/67  Pulse: 89  Weight: 203 lb (92.1 kg)  Body mass index is 34.84 kg/m.        Physical Examination:   General appearance: Well appearing, and in no distress  Mental status: Alert, oriented to person, place, and time  Skin: Warm & dry  Cardiovascular: Normal heart rate noted  Respiratory: Normal respiratory effort, no distress  Abdomen: Soft, gravid, nontender  Pelvic: Cervical exam deferred         Extremities:    Fetal Status: Fetal Heart Rate (bpm): 162        No results found for this or any previous visit (from the past 24 hour(s)).  Assessment & Plan:  1) Low-risk pregnancy G2P1001 at [redacted]w[redacted]d with an Estimated Date of Delivery: 01/28/23   2) Constipation, tips given  3) Hx gHTN, taking bASA, baseline urine P/C ratio today   Meds: No orders of the defined types were placed in this encounter.  Labs/procedures today: urine P/C ratio (didn't get at [facility]); 2nd IT  Plan:  Continue routine obstetrical care   Reviewed: Preterm labor  symptoms and general obstetric precautions including but not limited to vaginal bleeding, contractions, leaking of fluid and fetal movement were reviewed in detail with the patient.  All questions were answered. Has home bp cuff. Check bp weekly, let us know if >140/90.   Follow-up: Return for As scheduled.  Orders Placed This Encounter  Procedures   Protein / creatinine ratio, urine   INTEGRATED 2   Myrtis Ser [facility] 08/19/2022 3:58 PM

## 2022-08-19 NOTE — Patient Instructions (Signed)
[place], thank you for choosing our office today! We appreciate the opportunity to meet your healthcare needs. You may receive a short survey by mail, e-mail, or through [employer]. If you are happy with your care we would appreciate if you could take just a few minutes to complete the survey questions. We read all of your comments and take your feedback very seriously. Thank you again for choosing our office.  [facility] [employer] Team at [place] [facility] ([place], [place] [address]) Entrance C, located off of [address]  Go to [email or web address] to register for FREE online childbirth classes  Call the office [phone]) or go to [facility] if: You begin to severe cramping Your water breaks.  Sometimes it is a big gush of fluid, sometimes it is just a trickle that keeps getting your panties wet or running down your legs You have vaginal bleeding.  It is normal to have a small amount of spotting if your cervix was checked.   [facility] Pediatricians/Family Doctors [facility]): [address] [place], [facility]: [address] [place], [phone]                [facility]): [place], [phone] (call to ask if accepting patients) [facility] Department: [address], [place], [place] Pediatricians/Family Doctors [facility]): [place] [place], [address], [facility]: [address] [place], [place] [facility]: [place], [place] [facility]): [phone] [facility]: [address] [place]: [address] [place]: [phone], [phone]  Home Blood Pressure Monitoring for Patients   Your provider has recommended that you check your blood pressure (BP) at least once a week at home. If you do not have a blood pressure cuff at home, one will be provided for you. Contact your provider if you have not received your monitor within 1 week.   Helpful Tips for Accurate Home Blood Pressure Checks  Don't smoke, exercise, or drink caffeine 30 minutes before checking your BP Use the restroom before checking your BP (a full bladder can raise your pressure) Relax in a comfortable upright chair Feet on the ground Left arm resting comfortably on a flat surface at the level of your heart Legs uncrossed Back supported Sit quietly and don't talk Place the cuff on your bare arm Adjust snuggly, so that only two fingertips can fit between your skin and the top of the cuff Check 2 readings separated by at least one minute Keep a log of your BP readings For a visual, please reference this diagram: http://ccnc.care/bpdiagram  Provider Name: [facility]     Phone: [phone]  Zone 1: ALL CLEAR  Continue to monitor your symptoms:  BP reading is less than 140 (top number) or less than 90 (bottom number)  No right upper stomach pain No headaches or seeing spots No feeling nauseated or throwing up No swelling in face and hands  Zone 2: CAUTION Call your doctor's office for any of the following:  BP reading is greater than 140 (top number) or greater than  90 (bottom number)  Stomach pain under your ribs in the middle or right side Headaches or seeing spots Feeling nauseated or throwing up Swelling in face and hands  Zone 3: EMERGENCY  Seek immediate medical care if you have any of the following:  BP reading is greater than160 (top number) or greater than 110 (bottom number) Severe headaches not improving with Tylenol Serious difficulty catching your breath Any worsening symptoms from  Zone 2     Second Trimester of Pregnancy The second trimester is from week 14 through week 27 (months 4 through 6). The second trimester is often a time when you feel your best. Your body has adjusted to being pregnant, and you begin to feel better physically. Usually, morning sickness has lessened or quit completely, you may have more energy, and you may have an increase in appetite. The second trimester is also a time when the fetus is growing rapidly. At the end of the sixth month, the fetus is about 9 inches long and weighs about 1 pounds. You will likely begin to feel the baby move (quickening) between 16 and 20 weeks of pregnancy. Body changes during your second trimester Your body continues to go through many changes during your second trimester. The changes vary from woman to woman. Your weight will continue to increase. You will notice your lower abdomen bulging out. You may begin to get stretch marks on your hips, abdomen, and breasts. You may develop headaches that can be relieved by medicines. The medicines should be approved by your health care provider. You may urinate more often because the fetus is pressing on your bladder. You may develop or continue to have heartburn as a result of your pregnancy. You may develop constipation because certain hormones are causing the muscles that push waste through your intestines to slow down. You may develop hemorrhoids or swollen, bulging veins (varicose veins). You may have back pain. This is caused by: Weight gain. Pregnancy hormones that are relaxing the joints in your pelvis. A shift in weight and the muscles that support your balance. Your breasts will continue to grow and they will continue to become tender. Your gums may bleed and may be sensitive to brushing and flossing. Dark spots or blotches (chloasma, mask of pregnancy) may develop on your face. This will likely fade after the baby is born. A dark line from your belly button to  the pubic area (linea nigra) may appear. This will likely fade after the baby is born. You may have changes in your hair. These can include thickening of your hair, rapid growth, and changes in texture. Some women also have hair loss during or after pregnancy, or hair that feels dry or thin. Your hair will most likely return to normal after your baby is born.  What to expect at prenatal visits During a routine prenatal visit: You will be weighed to make sure you and the fetus are growing normally. Your blood pressure will be taken. Your abdomen will be measured to track your baby's growth. The fetal heartbeat will be listened to. Any test results from the previous visit will be discussed.  Your health care provider may ask you: How you are feeling. If you are feeling the baby move. If you have had any abnormal symptoms, such as leaking fluid, bleeding, severe headaches, or abdominal cramping. If you are using any tobacco products, including cigarettes, chewing tobacco, and electronic cigarettes. If you have any questions.  Other tests that may be performed during  your second trimester include: Blood tests that check for: Low iron levels (anemia). High blood sugar that affects pregnant women (gestational diabetes) between 41 and 28 weeks. Rh antibodies. This is to check for a protein on red blood cells (Rh factor). Urine tests to check for infections, diabetes, or protein in the urine. An ultrasound to confirm the proper growth and development of the baby. An amniocentesis to check for possible genetic problems. Fetal screens for spina bifida and Down syndrome. HIV (human immunodeficiency virus) testing. Routine prenatal testing includes screening for HIV, unless you choose not to have this test.  Follow these instructions at home: Medicines Follow your health care provider's instructions regarding medicine use. Specific medicines may be either safe or unsafe to take during  pregnancy. Take a prenatal vitamin that contains at least 600 micrograms (mcg) of folic acid. If you develop constipation, try taking a stool softener if your health care provider approves. Eating and drinking Eat a balanced diet that includes fresh fruits and vegetables, whole grains, good sources of protein such as meat, eggs, or tofu, and low-fat dairy. Your health care provider will help you determine the amount of weight gain that is right for you. Avoid raw meat and uncooked cheese. These carry germs that can cause birth defects in the baby. If you have low calcium intake from food, talk to your health care provider about whether you should take a daily calcium supplement. Limit foods that are high in fat and processed sugars, such as fried and sweet foods. To prevent constipation: Drink enough fluid to keep your urine clear or pale yellow. Eat foods that are high in fiber, such as fresh fruits and vegetables, whole grains, and beans. Activity Exercise only as directed by your health care provider. Most women can continue their usual exercise routine during pregnancy. Try to exercise for 30 minutes at least 5 days a week. Stop exercising if you experience uterine contractions. Avoid heavy lifting, wear low heel shoes, and practice good posture. A sexual relationship may be continued unless your health care provider directs you otherwise. Relieving pain and discomfort Wear a good support bra to prevent discomfort from breast tenderness. Take warm sitz baths to soothe any pain or discomfort caused by hemorrhoids. Use hemorrhoid cream if your health care provider approves. Rest with your legs elevated if you have leg cramps or low back pain. If you develop varicose veins, wear support hose. Elevate your feet for 15 minutes, 3-4 times a day. Limit salt in your diet. Prenatal Care Write down your questions. Take them to your prenatal visits. Keep all your prenatal visits as told by your health  care provider. This is important. Safety Wear your seat belt at all times when driving. Make a list of emergency phone numbers, including numbers for family, friends, the hospital, and police and fire departments. General instructions Ask your health care provider for a referral to a local prenatal education class. Begin classes no later than the beginning of month 6 of your pregnancy. Ask for help if you have counseling or nutritional needs during pregnancy. Your health care provider can offer advice or refer you to specialists for help with various needs. Do not use hot tubs, steam rooms, or saunas. Do not douche or use tampons or scented sanitary pads. Do not cross your legs for long periods of time. Avoid cat litter boxes and soil used by cats. These carry germs that can cause birth defects in the baby and possibly loss of the  fetus by miscarriage or stillbirth. Avoid all smoking, herbs, alcohol, and unprescribed drugs. Chemicals in these products can affect the formation and growth of the baby. Do not use any products that contain nicotine or tobacco, such as cigarettes and e-cigarettes. If you need help quitting, ask your health care provider. Visit your dentist if you have not gone yet during your pregnancy. Use a soft toothbrush to brush your teeth and be gentle when you floss. Contact a health care provider if: You have dizziness. You have mild pelvic cramps, pelvic pressure, or nagging pain in the abdominal area. You have persistent nausea, vomiting, or diarrhea. You have a bad smelling vaginal discharge. You have pain when you urinate. Get help right away if: You have a fever. You are leaking fluid from your vagina. You have spotting or bleeding from your vagina. You have severe abdominal cramping or pain. You have rapid weight gain or weight loss. You have shortness of breath with chest pain. You notice sudden or extreme swelling of your face, hands, ankles, feet, or legs. You  have not felt your baby move in over an hour. You have severe headaches that do not go away when you take medicine. You have vision changes. Summary The second trimester is from week 14 through week 27 (months 4 through 6). It is also a time when the fetus is growing rapidly. Your body goes through many changes during pregnancy. The changes vary from woman to woman. Avoid all smoking, herbs, alcohol, and unprescribed drugs. These chemicals affect the formation and growth your baby. Do not use any tobacco products, such as cigarettes, chewing tobacco, and e-cigarettes. If you need help quitting, ask your health care provider. Contact your health care provider if you have any questions. Keep all prenatal visits as told by your health care provider. This is important. This information is not intended to replace advice given to you by your health care provider. Make sure you discuss any questions you have with your health care provider. Document Released: 06/16/2001 Document Revised: 11/28/2015 Document Reviewed: 08/23/2012 Elsevier Interactive Patient Education  2017 [employer].

## 2022-08-20 LAB — PROTEIN / CREATININE RATIO, URINE
Creatinine, Urine: 102.4 mg/dL
Protein, Ur: 9.3 mg/dL
Protein/Creat Ratio: 91 mg/g creat (ref 0–200)

## 2022-08-21 LAB — INTEGRATED 2
AFP MoM: 0.89
Alpha-Fetoprotein: 25.9 ng/mL
Crown Rump Length: 69.5 mm
DIA MoM: 0.76
DIA Value: 95.7 pg/mL
Estriol, Unconjugated: 1.2 ng/mL
Gest. Age on Collection Date: 13 weeks
Gestational Age: 17 weeks
Maternal Age at EDD: 21.8 yr
Nuchal Translucency (NT): 1.6 mm
Nuchal Translucency MoM: 1.02
Number of Fetuses: 1
PAPP-A MoM: 0.74
PAPP-A Value: 632.1 ng/mL
Test Results:: NEGATIVE
Weight: 202 [lb_av]
Weight: 202 [lb_av]
hCG MoM: 0.72
hCG Value: 18 IU/mL
uE3 MoM: 1.12

## 2022-09-09 ENCOUNTER — Ambulatory Visit (INDEPENDENT_AMBULATORY_CARE_PROVIDER_SITE_OTHER): Payer: Medicaid Other

## 2022-09-09 ENCOUNTER — Encounter: Payer: Self-pay | Admitting: Advanced Practice Midwife

## 2022-09-09 ENCOUNTER — Ambulatory Visit (INDEPENDENT_AMBULATORY_CARE_PROVIDER_SITE_OTHER): Payer: Medicaid Other | Admitting: Advanced Practice Midwife

## 2022-09-09 VITALS — BP 128/71 | HR 78 | Wt 202.4 lb

## 2022-09-09 DIAGNOSIS — Z3A20 20 weeks gestation of pregnancy: Secondary | ICD-10-CM

## 2022-09-09 DIAGNOSIS — Z3A19 19 weeks gestation of pregnancy: Secondary | ICD-10-CM

## 2022-09-09 DIAGNOSIS — Z363 Encounter for antenatal screening for malformations: Secondary | ICD-10-CM

## 2022-09-09 DIAGNOSIS — Z3480 Encounter for supervision of other normal pregnancy, unspecified trimester: Secondary | ICD-10-CM

## 2022-09-09 DIAGNOSIS — Z3482 Encounter for supervision of other normal pregnancy, second trimester: Secondary | ICD-10-CM

## 2022-09-09 NOTE — Progress Notes (Signed)
[place] [id number] wks,cephalic,cx 4.6 cm,posterior fundal placenta gr 0,normal ovaries,SVP of fluid 4.7 cm,FHR 137 bpm,bilateral renal pelvic dilatation,RK 5.4 mm,LK 5.4 mm,EFW 347 g 72%,anatomy complete,no obvious abnormalities

## 2022-09-09 NOTE — Patient Instructions (Signed)
Dawn Blankenship, thank you for choosing our office today! We appreciate the opportunity to meet your healthcare needs. You may receive a short survey by mail, e-mail, or through MyChart. If you are happy with your care we would appreciate if you could take just a few minutes to complete the survey questions. We read all of your comments and take your feedback very seriously. Thank you again for choosing our office.  [facility] Team at [facility] ([address] [place], [place] [address]) Entrance C, located off of [address] Free 24/7 valet parking  Go to [email or web address] to register for FREE online childbirth classes  Call the office ([phone]) or go to [facility] if: You begin to severe cramping Your water breaks.  Sometimes it is a big gush of fluid, sometimes it is just a trickle that keeps getting your panties wet or running down your legs You have vaginal bleeding.  It is normal to have a small amount of spotting if your cervix was checked.   [place] [facility]/Family Doctors [facility] ([facility]): [address], [phone]           [facility]: [address], [phone]                [facility] ([facility]): [address], [phone] (call to ask if accepting patients) [facility]: [address], [place], [phone]    [place] [facility]/Family Doctors [facility] ([facility]): [address], [phone] [facility]: [address], [phone] [facility]: [address], [phone]  [place] [facility]  [facility] ([facility]): [phone] [facility]: [address], [phone]   [place] [facility]: [address], [phone]  [place] [facility]  [facility]: [address], [phone]  Home Blood Pressure Monitoring for Patients   Your provider has recommended that you check your blood pressure (BP) at least once a week at home. If you do not have a blood pressure cuff at home, one will be provided for you. Contact your provider if you have not received your monitor within 1 week.   Helpful Tips for Accurate Home Blood Pressure Checks  Don't smoke, exercise, or drink caffeine 30 minutes before checking your BP Use the restroom before checking your BP (a full bladder can raise your pressure) Relax in a comfortable upright chair Feet on the ground Left arm resting comfortably on a flat surface at the level of your heart Legs uncrossed Back supported Sit quietly and don't talk Place the cuff on your bare arm Adjust snuggly, so that only two fingertips can fit between your skin and the top of the cuff Check 2 readings separated by at least one minute Keep a log of your BP readings For a visual, please reference this diagram: http://ccnc.care/bpdiagram  Provider Name: [facility]     Phone: [phone]  Zone 1: ALL CLEAR  Continue to monitor your symptoms:  BP reading is less than 140 (top number) or less than 90 (bottom number)  No right upper stomach pain No headaches or seeing spots No feeling nauseated or throwing up No swelling in face and hands  Zone 2: CAUTION Call your doctor's office for any of the following:  BP reading is greater than 140 (top number) or greater than   90 (bottom number)  Stomach pain under your ribs in the middle or right side Headaches or seeing spots Feeling nauseated or throwing up Swelling in face and hands  Zone 3: EMERGENCY  Seek immediate medical care if you have any of the following:  BP reading is greater than160 (top number) or greater than 110 (bottom number) Severe headaches not improving with Tylenol Serious difficulty catching your breath Any worsening symptoms from  Zone 2     Second Trimester of Pregnancy The second trimester is from week 14 through week 27 (months 4 through 6). The second trimester is often a time when you feel your best. Your body has adjusted to being pregnant, and you begin to feel better physically. Usually, morning sickness has lessened or quit completely, you may have more energy, and you may have an increase in appetite. The second trimester is also a time when the fetus is growing rapidly. At the end of the sixth month, the fetus is about 9 inches long and weighs about 1 pounds. You will likely begin to feel the baby move (quickening) between 16 and 20 weeks of pregnancy. Body changes during your second trimester Your body continues to go through many changes during your second trimester. The changes vary from woman to woman. Your weight will continue to increase. You will notice your lower abdomen bulging out. You may begin to get stretch marks on your hips, abdomen, and breasts. You may develop headaches that can be relieved by medicines. The medicines should be approved by your health care provider. You may urinate more often because the fetus is pressing on your bladder. You may develop or continue to have heartburn as a result of your pregnancy. You may develop constipation because certain hormones are causing the muscles that push waste through your intestines to slow down. You may develop hemorrhoids or swollen, bulging veins (varicose veins). You may have back pain. This is caused by: Weight gain. Pregnancy hormones that are relaxing the joints in your pelvis. A shift in weight and the muscles that support your balance. Your breasts will continue to grow and they will continue to become tender. Your gums may bleed and may be sensitive to brushing and flossing. Dark spots or blotches (chloasma, mask of pregnancy) may develop on your face. This will likely fade after the baby is born. A dark line from your belly button to  the pubic area (linea nigra) may appear. This will likely fade after the baby is born. You may have changes in your hair. These can include thickening of your hair, rapid growth, and changes in texture. Some women also have hair loss during or after pregnancy, or hair that feels dry or thin. Your hair will most likely return to normal after your baby is born.  What to expect at prenatal visits During a routine prenatal visit: You will be weighed to make sure you and the fetus are growing normally. Your blood pressure will be taken. Your abdomen will be measured to track your baby's growth. The fetal heartbeat will be listened to. Any test results from the previous visit will be discussed.  Your health care provider may ask you: How you are feeling. If you are feeling the baby move. If you have had any abnormal symptoms, such as leaking fluid, bleeding, severe headaches, or abdominal cramping. If you are using any tobacco products, including cigarettes, chewing tobacco, and electronic cigarettes. If you have any questions.  Other tests that may be performed during   your second trimester include: Blood tests that check for: Low iron levels (anemia). High blood sugar that affects pregnant women (gestational diabetes) between 24 and 28 weeks. Rh antibodies. This is to check for a protein on red blood cells (Rh factor). Urine tests to check for infections, diabetes, or protein in the urine. An ultrasound to confirm the proper growth and development of the baby. An amniocentesis to check for possible genetic problems. Fetal screens for spina bifida and Down syndrome. HIV (human immunodeficiency virus) testing. Routine prenatal testing includes screening for HIV, unless you choose not to have this test.  Follow these instructions at home: Medicines Follow your health care provider's instructions regarding medicine use. Specific medicines may be either safe or unsafe to take during  pregnancy. Take a prenatal vitamin that contains at least 600 micrograms (mcg) of folic acid. If you develop constipation, try taking a stool softener if your health care provider approves. Eating and drinking Eat a balanced diet that includes fresh fruits and vegetables, whole grains, good sources of protein such as meat, eggs, or tofu, and low-fat dairy. Your health care provider will help you determine the amount of weight gain that is right for you. Avoid raw meat and uncooked cheese. These carry germs that can cause birth defects in the baby. If you have low calcium intake from food, talk to your health care provider about whether you should take a daily calcium supplement. Limit foods that are high in fat and processed sugars, such as fried and sweet foods. To prevent constipation: Drink enough fluid to keep your urine clear or pale yellow. Eat foods that are high in fiber, such as fresh fruits and vegetables, whole grains, and beans. Activity Exercise only as directed by your health care provider. Most women can continue their usual exercise routine during pregnancy. Try to exercise for 30 minutes at least 5 days a week. Stop exercising if you experience uterine contractions. Avoid heavy lifting, wear low heel shoes, and practice good posture. A sexual relationship may be continued unless your health care provider directs you otherwise. Relieving pain and discomfort Wear a good support bra to prevent discomfort from breast tenderness. Take warm sitz baths to soothe any pain or discomfort caused by hemorrhoids. Use hemorrhoid cream if your health care provider approves. Rest with your legs elevated if you have leg cramps or low back pain. If you develop varicose veins, wear support hose. Elevate your feet for 15 minutes, 3-4 times a day. Limit salt in your diet. Prenatal Care Write down your questions. Take them to your prenatal visits. Keep all your prenatal visits as told by your health  care provider. This is important. Safety Wear your seat belt at all times when driving. Make a list of emergency phone numbers, including numbers for family, friends, the hospital, and police and fire departments. General instructions Ask your health care provider for a referral to a local prenatal education class. Begin classes no later than the beginning of month 6 of your pregnancy. Ask for help if you have counseling or nutritional needs during pregnancy. Your health care provider can offer advice or refer you to specialists for help with various needs. Do not use hot tubs, steam rooms, or saunas. Do not douche or use tampons or scented sanitary pads. Do not cross your legs for long periods of time. Avoid cat litter boxes and soil used by cats. These carry germs that can cause birth defects in the baby and possibly loss of the   fetus by miscarriage or stillbirth. Avoid all smoking, herbs, alcohol, and unprescribed drugs. Chemicals in these products can affect the formation and growth of the baby. Do not use any products that contain nicotine or tobacco, such as cigarettes and e-cigarettes. If you need help quitting, ask your health care provider. Visit your dentist if you have not gone yet during your pregnancy. Use a soft toothbrush to brush your teeth and be gentle when you floss. Contact a health care provider if: You have dizziness. You have mild pelvic cramps, pelvic pressure, or nagging pain in the abdominal area. You have persistent nausea, vomiting, or diarrhea. You have a bad smelling vaginal discharge. You have pain when you urinate. Get help right away if: You have a fever. You are leaking fluid from your vagina. You have spotting or bleeding from your vagina. You have severe abdominal cramping or pain. You have rapid weight gain or weight loss. You have shortness of breath with chest pain. You notice sudden or extreme swelling of your face, hands, ankles, feet, or legs. You  have not felt your baby move in over an hour. You have severe headaches that do not go away when you take medicine. You have vision changes. Summary The second trimester is from week 14 through week 27 (months 4 through 6). It is also a time when the fetus is growing rapidly. Your body goes through many changes during pregnancy. The changes vary from woman to woman. Avoid all smoking, herbs, alcohol, and unprescribed drugs. These chemicals affect the formation and growth your baby. Do not use any tobacco products, such as cigarettes, chewing tobacco, and e-cigarettes. If you need help quitting, ask your health care provider. Contact your health care provider if you have any questions. Keep all prenatal visits as told by your health care provider. This is important. This information is not intended to replace advice given to you by your health care provider. Make sure you discuss any questions you have with your health care provider. Document Released: 06/16/2001 Document Revised: 11/28/2015 Document Reviewed: 08/23/2012 Elsevier Interactive Patient Education  2017 [employer]  

## 2022-09-09 NOTE — Progress Notes (Signed)
   LOW-RISK PREGNANCY VISIT Patient name: Dawn Blankenship MRN [id number]  Date of birth: [birth year] Chief Complaint:   Routine Prenatal Visit and Pregnancy Ultrasound  History of Present Illness:   Dawn Blankenship is a 22 y.o. G61P1001 female at 30w6dwith an Estimated Date of Delivery: 01/28/23 being seen today for ongoing management of a low-risk pregnancy.  Today she reports no complaints. Contractions: Not present.  .  Movement: Present. denies leaking of fluid. Review of Systems:   Pertinent items are noted in HPI Denies abnormal vaginal discharge w/ itching/odor/irritation, headaches, visual changes, shortness of breath, chest pain, abdominal pain, severe nausea/vomiting, or problems with urination or bowel movements unless otherwise stated above. Pertinent History Reviewed:  Reviewed past medical,surgical, social, obstetrical and family history.  Reviewed problem list, medications and allergies. Physical Assessment:   Vitals:   09/09/22 1529  BP: 128/71  Pulse: 78  Weight: 202 lb 6.4 oz (91.8 kg)  Body mass index is 34.74 kg/m.        Physical Examination:   General appearance: Well appearing, and in no distress  Mental status: Alert, oriented to person, place, and time  Skin: Warm & dry  Cardiovascular: Normal heart rate noted  Respiratory: Normal respiratory effort, no distress  Abdomen: Soft, gravid, nontender  Pelvic: Cervical exam deferred         Extremities: Edema: None  Fetal Status: Fetal Heart Rate (bpm): 137 u/s   Movement: Present    Anatomy u/s: [id number],cephalic,cx 4.6 cm,posterior fundal placenta gr 0,normal ovaries,SVP of fluid 4.7 cm,FHR 137 bpm,bilateral renal pelvic dilatation,RK 5.4 mm,LK 5.4 mm,EFW 347 g 72%,anatomy complete,no obvious abnormalities   No results found for this or any previous visit (from the past 24 hour(s)).  Assessment & Plan:  1) Low-risk pregnancy G2P1001 at 166w6dith an Estimated Date of Delivery: 01/28/23   2)  Bilat RPD 5.22m522mcheck w MD to see if f/u needed after 32wks  3) Hx gHTN, taking bASA   Meds: No orders of the defined types were placed in this encounter.  Labs/procedures today: anatomy u/s  Plan:  Continue routine obstetrical care   Reviewed: Preterm labor symptoms and general obstetric precautions including but not limited to vaginal bleeding, contractions, leaking of fluid and fetal movement were reviewed in detail with the patient.  All questions were answered. Has home bp cuff. Check bp weekly, let us [place] if >140/90.   Follow-up: Return in about 4 weeks (around 10/07/2022) [place].  No orders of the defined types were placed in this encounter.  KimMyrtis SerM 09/09/2022 4:45 PM

## 2022-09-22 ENCOUNTER — Encounter: Payer: Self-pay | Admitting: Advanced Practice Midwife

## 2022-10-07 ENCOUNTER — Encounter: Payer: Self-pay | Admitting: Advanced Practice Midwife

## 2022-10-07 ENCOUNTER — Telehealth (INDEPENDENT_AMBULATORY_CARE_PROVIDER_SITE_OTHER): Payer: Medicaid Other | Admitting: Advanced Practice Midwife

## 2022-10-07 VITALS — BP 125/66 | HR 81

## 2022-10-07 DIAGNOSIS — Z3480 Encounter for supervision of other normal pregnancy, unspecified trimester: Secondary | ICD-10-CM

## 2022-10-07 DIAGNOSIS — Z3A23 23 weeks gestation of pregnancy: Secondary | ICD-10-CM

## 2022-10-07 DIAGNOSIS — Z3482 Encounter for supervision of other normal pregnancy, second trimester: Secondary | ICD-10-CM

## 2022-10-07 NOTE — Patient Instructions (Signed)
Dawn Blankenship, I greatly value your feedback.  If you receive a survey following your visit with [place] today, we appreciate you taking the time to fill it out.  Thanks, Derrill Memo, CNM   You will have your sugar test next visit.  Please do not eat or drink anything after midnight the night before you come, not even water.  You will be here for at least two hours.  Please make an appointment online for the bloodwork at [email or web address] for 8:30am (or as close to this as possible). Make sure you select the [facility] service center. The day of the appointment, check in with our office first, then you will go to [place] to start the sugar test.    [place]!!! It is now [place] [facility] ([place], [place] [address]) Entrance C, located off of [address] parking  Go to [email or web address] to register for FREE online childbirth classes   Call the office [phone]) or go to [facility] if: You begin to have strong, frequent contractions Your water breaks.  Sometimes it is a big gush of fluid, sometimes it is just a trickle that keeps getting your panties wet or running down your legs You have vaginal bleeding.  It is normal to have a small amount of spotting if your cervix was checked.  You don't feel your baby moving like normal.  If you don't, get you something to eat and drink and lay down and focus on feeling your baby move.   If your baby is still not moving like normal, you should call the office or go to [place] Pediatric[facility]/Family Doctors: [place] [phone]                [place] [phone] (usually not accepting new patients unless you have family there already, you are always welcome to call and ask)      [facility] Department [phone]       [facility] Pediatricians/Family Doctors:  [facility]: [phone] [facility]: [phone] [facility]: [place] Doctors:  [facility]: [place] [facility]: [place]: [place]: [phone]   Home Blood Pressure Monitoring for Patients   Your provider has recommended that you check your blood pressure (BP) at least once a week at home. If you do not have a blood pressure cuff at home, one will be provided for you. Contact your provider if you have not received your monitor within 1 week.   Helpful Tips for Accurate Home Blood Pressure Checks  Don't smoke, exercise, or drink caffeine 30 minutes before checking your BP Use the restroom before checking your BP (a full bladder can raise your pressure) Relax in a comfortable upright chair Feet on the ground Left arm resting comfortably on a flat surface at the level of your heart Legs uncrossed Back supported Sit quietly and don't talk Place the cuff on your bare arm Adjust snuggly, so that only two fingertips can fit between your skin and the top of the cuff Check 2 readings separated by at least one minute Keep a log of your BP readings For a visual, please reference this diagram: http://ccnc.care/bpdiagram  Provider Name: [facility]     Phone: [phone]  Zone 1: ALL CLEAR  Continue to monitor your symptoms:  BP reading is less than 140 (top number) or less than 90 (bottom number)  No right upper stomach pain No headaches or seeing spots No feeling nauseated or throwing up No swelling in face and hands  Zone 2: CAUTION Call your doctor's office for any of the following:  BP reading is greater than 140 (top number) or greater than 90 (bottom number)  Stomach pain under your ribs in the middle or right side Headaches or seeing spots Feeling nauseated or throwing up Swelling in face and hands  Zone 3: EMERGENCY   Seek immediate medical care if you have any of the following:  BP reading is greater than160 (top number) or greater than 110 (bottom number) Severe headaches not improving with Tylenol Serious difficulty catching your breath Any worsening symptoms from Zone 2   Second Trimester of Pregnancy The second trimester is from week 13 through week 28, months 4 through 6. The second trimester is often a time when you feel your best. Your body has also adjusted to being pregnant, and you begin to feel better physically. Usually, morning sickness has lessened or quit completely, you may have more energy, and you may have an increase in appetite. The second trimester is also a time when the fetus is growing rapidly. At the end of the sixth month, the fetus is about 9 inches long and weighs about 1 pounds. You will likely begin to feel the baby move (quickening) between 18 and 20 weeks of the pregnancy. BODY CHANGES Your body goes through many changes during pregnancy. The changes vary from woman to woman.  Your weight will continue to increase. You will notice your lower abdomen bulging out. You may begin to get stretch marks on your hips, abdomen, and breasts. You may develop headaches that can be relieved by medicines approved by your health care provider. You may urinate more often because the fetus is pressing on your bladder. You may develop or continue to have heartburn as a result of your pregnancy. You may develop constipation because certain hormones are causing the muscles that push waste through your intestines to slow down. You may develop hemorrhoids or swollen, bulging veins (varicose veins). You may have back pain because of the weight gain and pregnancy hormones relaxing your joints between the bones in your pelvis and as a result of a shift in weight and the muscles that support your balance. Your breasts will continue to grow and be tender. Your gums may bleed and may be sensitive to  brushing and flossing. Dark spots or blotches (chloasma, mask of pregnancy) may develop on your face. This will likely fade after the baby is born. A dark line from your belly button to the pubic area (linea nigra) may appear. This will likely fade after the baby is born. You may have changes in your hair. These can include thickening of your hair, rapid growth, and changes in texture. Some women also have hair loss during or after pregnancy, or hair that feels dry or thin. Your hair will most likely return to normal after your baby is born. WHAT TO EXPECT AT YOUR PRENATAL VISITS During a routine prenatal visit: You will be weighed to make sure you and the fetus are growing normally. Your blood pressure will be taken. Your abdomen will be measured to track your baby's growth. The fetal heartbeat will be listened to. Any test results from the previous visit will be discussed. Your health care provider  may ask you: How you are feeling. If you are feeling the baby move. If you have had any abnormal symptoms, such as leaking fluid, bleeding, severe headaches, or abdominal cramping. If you have any questions. Other tests that may be performed during your second trimester include: Blood tests that check for: Low iron levels (anemia). Gestational diabetes (between 24 and 28 weeks). Rh antibodies. Urine tests to check for infections, diabetes, or protein in the urine. An ultrasound to confirm the proper growth and development of the baby. An amniocentesis to check for possible genetic problems. Fetal screens for spina bifida and Down syndrome. HOME CARE INSTRUCTIONS  Avoid all smoking, herbs, alcohol, and unprescribed drugs. These chemicals affect the formation and growth of the baby. Follow your health care provider's instructions regarding medicine use. There are medicines that are either safe or unsafe to take during pregnancy. Exercise only as directed by your health care provider.  Experiencing uterine cramps is a good sign to stop exercising. Continue to eat regular, healthy meals. Wear a good support bra for breast tenderness. Do not use hot tubs, steam rooms, or saunas. Wear your seat belt at all times when driving. Avoid raw meat, uncooked cheese, cat litter boxes, and soil used by cats. These carry germs that can cause birth defects in the baby. Take your prenatal vitamins. Try taking a stool softener (if your health care provider approves) if you develop constipation. Eat more high-fiber foods, such as fresh vegetables or fruit and whole grains. Drink plenty of fluids to keep your urine clear or pale yellow. Take warm sitz baths to soothe any pain or discomfort caused by hemorrhoids. Use hemorrhoid cream if your health care provider approves. If you develop varicose veins, wear support hose. Elevate your feet for 15 minutes, 3-4 times a day. Limit salt in your diet. Avoid heavy lifting, wear low heel shoes, and practice good posture. Rest with your legs elevated if you have leg cramps or low back pain. Visit your dentist if you have not gone yet during your pregnancy. Use a soft toothbrush to brush your teeth and be gentle when you floss. A sexual relationship may be continued unless your health care provider directs you otherwise. Continue to go to all your prenatal visits as directed by your health care provider. SEEK MEDICAL CARE IF:  You have dizziness. You have mild pelvic cramps, pelvic pressure, or nagging pain in the abdominal area. You have persistent nausea, vomiting, or diarrhea. You have a bad smelling vaginal discharge. You have pain with urination. SEEK IMMEDIATE MEDICAL CARE IF:  You have a fever. You are leaking fluid from your vagina. You have spotting or bleeding from your vagina. You have severe abdominal cramping or pain. You have rapid weight gain or loss. You have shortness of breath with chest pain. You notice sudden or extreme swelling  of your face, hands, ankles, feet, or legs. You have not felt your baby move in over an hour. You have severe headaches that do not go away with medicine. You have vision changes. Document Released: 06/16/2001 Document Revised: 06/27/2013 Document Reviewed: 08/23/2012 [facility] Patient Information 2015 [place], [place]. This information is not intended to replace advice given to you by your health care provider. Make sure you discuss any questions you have with your health care provider.

## 2022-10-07 NOTE — Progress Notes (Signed)
TELEHEALTH VIRTUAL OBSTETRICS VISIT ENCOUNTER NOTE Patient name: Dawn Blankenship MRN [id number]  Date of birth: 08/08/2000  I connected with patient on 10/07/22 at 11:50 AM EDT by MyChart video and verified that I am speaking with the correct person using two identifiers. Due to COVID-19 recommendations, pt is not currently in our office; she is at home; provider is in the office.    I discussed the limitations, risks, security and privacy concerns of performing an evaluation and management service by telephone and the availability of in person appointments. I also discussed with the patient that there may be a patient responsible charge related to this service. The patient expressed understanding and agreed to proceed.  Chief Complaint:   Routine Prenatal Visit  History of Present Illness:   Dawn Blankenship is a 22 y.o. G38P1001 female at [redacted]w[redacted]d with an Estimated Date of Delivery: 01/28/23 being evaluated today for ongoing management of a low-risk pregnancy.     07/22/2022   11:54 AM 02/24/2021    9:12 AM 10/23/2020   11:05 AM 09/13/2020   11:00 AM  Depression screen PHQ 2/9  Decreased Interest 0 0 0 1  Down, Depressed, Hopeless 0 0 1 0  PHQ - 2 Score 0 0 1 1  Altered sleeping 1 1 1 1   Tired, decreased energy 1 0 1 1  Change in appetite 0 0 0 0  Feeling bad or failure about yourself  0 0 1 0  Trouble concentrating 0 0 1 1  Moving slowly or fidgety/restless 0 0 0 0  Suicidal thoughts 0 0 0 0  PHQ-9 Score 2 1 5 4     Today she reports  doing well; baby is moving a lot . Contractions: Not present.  .  Movement: Present. denies leaking of fluid. Review of Systems:   Pertinent items are noted in HPI Denies abnormal vaginal discharge w/ itching/odor/irritation, headaches, visual changes, shortness of breath, chest pain, abdominal pain, severe nausea/vomiting, or problems with urination or bowel movements unless otherwise stated above. Pertinent History Reviewed:  Reviewed past  medical,surgical, social, obstetrical and family history.  Reviewed problem list, medications and allergies. Physical Assessment:   Vitals:   10/07/22 1150  BP: 125/66  Pulse: 81  There is no height or weight on file to calculate BMI.        Physical Examination:   General:  Alert, oriented and cooperative.   Mental Status: Normal mood and affect perceived. Normal judgment and thought content.  Rest of physical exam deferred due to type of encounter  No results found for this or any previous visit (from the past 24 hour(s)).  Assessment & Plan:  1) Pregnancy G2P1001 at [redacted]w[redacted]d with an Estimated Date of Delivery: 01/28/23   2) Hx gHTN, taking bASA   Meds: No orders of the defined types were placed in this encounter.   Labs/procedures today: none  Plan:  Continue routine obstetrical care.  Has home bp cuff.  Check bp weekly, let us know if >140/90.  Next visit: prefers will be in person for PN2     Reviewed: Preterm labor symptoms and general obstetric precautions including but not limited to vaginal bleeding, contractions, leaking of fluid and fetal movement were reviewed in detail with the patient. The patient was advised to call back or seek an in-person office evaluation/go to [facility] at [facility] for any urgent or concerning symptoms. All questions were answered. Please refer to After Visit Summary for other counseling recommendations.  I provided 10 minutes of non-face-to-face time during this encounter.  Follow-up: Return in about 4 weeks (around 11/04/2022) for LROB, PN2, in person.  No orders of the defined types were placed in this encounter.  Myrtis Ser CNM 10/07/2022 12:09 PM

## 2022-11-04 ENCOUNTER — Encounter: Payer: Self-pay | Admitting: Advanced Practice Midwife

## 2022-11-04 ENCOUNTER — Other Ambulatory Visit: Payer: Medicaid Other

## 2022-11-04 ENCOUNTER — Ambulatory Visit (INDEPENDENT_AMBULATORY_CARE_PROVIDER_SITE_OTHER): Payer: Medicaid Other | Admitting: Advanced Practice Midwife

## 2022-11-04 VITALS — BP 127/67 | HR 87 | Wt 208.0 lb

## 2022-11-04 DIAGNOSIS — Z3A27 27 weeks gestation of pregnancy: Secondary | ICD-10-CM

## 2022-11-04 DIAGNOSIS — Z3480 Encounter for supervision of other normal pregnancy, unspecified trimester: Secondary | ICD-10-CM

## 2022-11-04 DIAGNOSIS — Z131 Encounter for screening for diabetes mellitus: Secondary | ICD-10-CM

## 2022-11-04 DIAGNOSIS — O35EXX0 Maternal care for other (suspected) fetal abnormality and damage, fetal genitourinary anomalies, not applicable or unspecified: Secondary | ICD-10-CM

## 2022-11-04 NOTE — Patient Instructions (Signed)
Dawn Blankenship, thank you for choosing our office today! We appreciate the opportunity to meet your healthcare needs. You may receive a short survey by mail, e-mail, or through [employer]. If you are happy with your care we would appreciate if you could take just a few minutes to complete the survey questions. We read all of your comments and take your feedback very seriously. Thank you again for choosing our office.  Center for [employer] Team at [facility]  [facility] at [facility] ([address] [place], [place] [address]) Entrance C, located off of [address] Free 24/7 valet parking   CLASSES: Go to [email or web address] to register for classes (childbirth, breastfeeding, waterbirth, infant CPR, daddy bootcamp, etc.)  Call the office [phone]) or go to [facility] if: You begin to have strong, frequent contractions Your water breaks.  Sometimes it is a big gush of fluid, sometimes it is just a trickle that keeps getting your panties wet or running down your legs You have vaginal bleeding.  It is normal to have a small amount of spotting if your cervix was checked.  You don't feel your baby moving like normal.  If you don't, get you something to eat and drink and lay down and focus on feeling your baby move.   If your baby is still not moving like normal, you should call the office or go to [facility].  Call the office [phone]) or go to [facility] hospital for these signs of pre-eclampsia: Severe headache that does not go away with Tylenol Visual changes- seeing spots, double, blurred vision Pain under your right breast or upper abdomen that does not go away with Tums or heartburn medicine Nausea and/or vomiting Severe swelling in your hands, feet, and face   Tdap Vaccine It is recommended that you get the Tdap vaccine during the third trimester of EACH pregnancy to help protect your baby from getting pertussis (whooping cough) 27-36 weeks is the BEST time to do  this so that you can pass the protection on to your baby. During pregnancy is better than after pregnancy, but if you are unable to get it during pregnancy it will be offered at the hospital.  You can get this vaccine with [place], at the health department, your family doctor, or some local pharmacies Everyone who will be around your baby should also be up-to-date on their vaccines before the baby comes. Adults (who are not pregnant) only need 1 dose of Tdap during adulthood.   [facility] Pediatricians/Family Doctors [facility]): [address] Colette Ribas, [phone]           [facility]: [address], [phone]                [facility]): [address], [phone] (call to ask if accepting patients) [facility] Department: [address], [place], [phone]    [facility] Pediatricians/Family Doctors [facility]): [phone] [address], [phone] [facility]: [address] [place], [phone] [facility] [place]: [address], [phone]  [facility] Doctors  [facility]): [phone] [facility]: [address] [phone]   [facility] Doctors [facility]: [address] [phone]  [facility] Family Doctors  [facility]: [phone], [phone]  Home Blood Pressure Monitoring for Patients   Your provider has recommended that you check your  blood pressure (BP) at least once a week at home. If you do not have a blood pressure cuff at home, one will be provided for you. Contact your provider if you have not received your monitor within 1 week.   Helpful Tips for Accurate Home Blood Pressure Checks  Don't smoke, exercise, or drink caffeine 30 minutes before checking your BP Use the restroom before checking your BP (a full bladder can raise your  pressure) Relax in a comfortable upright chair Feet on the ground Left arm resting comfortably on a flat surface at the level of your heart Legs uncrossed Back supported Sit quietly and don't talk Place the cuff on your bare arm Adjust snuggly, so that only two fingertips can fit between your skin and the top of the cuff Check 2 readings separated by at least one minute Keep a log of your BP readings For a visual, please reference this diagram: http://ccnc.care/bpdiagram  Provider Name: [facility]     Phone: [phone]  Zone 1: ALL CLEAR  Continue to monitor your symptoms:  BP reading is less than 140 (top number) or less than 90 (bottom number)  No right upper stomach pain No headaches or seeing spots No feeling nauseated or throwing up No swelling in face and hands  Zone 2: CAUTION Call your doctor's office for any of the following:  BP reading is greater than 140 (top number) or greater than 90 (bottom number)  Stomach pain under your ribs in the middle or right side Headaches or seeing spots Feeling nauseated or throwing up Swelling in face and hands  Zone 3: EMERGENCY  Seek immediate medical care if you have any of the following:  BP reading is greater than160 (top number) or greater than 110 (bottom number) Severe headaches not improving with Tylenol Serious difficulty catching your breath Any worsening symptoms from Zone 2   Third Trimester of Pregnancy The third trimester is from week 29 through week 42, months 7 through 9. The third trimester is a time when the fetus is growing rapidly. At the end of the ninth month, the fetus is about 20 inches in length and weighs 6-10 pounds.  BODY CHANGES Your body goes through many changes during pregnancy. The changes vary from woman to woman.  Your weight will continue to increase. You can expect to gain 25-35 pounds (11-16 kg) by the end of the pregnancy. You may begin to get stretch marks on your hips, abdomen,  and breasts. You may urinate more often because the fetus is moving lower into your pelvis and pressing on your bladder. You may develop or continue to have heartburn as a result of your pregnancy. You may develop constipation because certain hormones are causing the muscles that push waste through your intestines to slow down. You may develop hemorrhoids or swollen, bulging veins (varicose veins). You may have pelvic pain because of the weight gain and pregnancy hormones relaxing your joints between the bones in your pelvis. Backaches may result from overexertion of the muscles supporting your posture. You may have changes in your hair. These can include thickening of your hair, rapid growth, and changes in texture. Some women also have hair loss during or after pregnancy, or hair that feels dry or thin. Your hair will most likely return to normal after your baby is born. Your breasts will continue to grow and be tender. A yellow discharge may leak from your breasts called colostrum. Your belly button may stick out. You may   feel short of breath because of your expanding uterus. You may notice the fetus "dropping," or moving lower in your abdomen. You may have a bloody mucus discharge. This usually occurs a few days to a week before labor begins. Your cervix becomes thin and soft (effaced) near your due date. WHAT TO EXPECT AT YOUR PRENATAL EXAMS  You will have prenatal exams every 2 weeks until week 36. Then, you will have weekly prenatal exams. During a routine prenatal visit: You will be weighed to make sure you and the fetus are growing normally. Your blood pressure is taken. Your abdomen will be measured to track your baby's growth. The fetal heartbeat will be listened to. Any test results from the previous visit will be discussed. You may have a cervical check near your due date to see if you have effaced. At around 36 weeks, your caregiver will check your cervix. At the same time, your  caregiver will also perform a test on the secretions of the vaginal tissue. This test is to determine if a type of bacteria, Group B streptococcus, is present. Your caregiver will explain this further. Your caregiver may ask you: What your birth plan is. How you are feeling. If you are feeling the baby move. If you have had any abnormal symptoms, such as leaking fluid, bleeding, severe headaches, or abdominal cramping. If you have any questions. Other tests or screenings that may be performed during your third trimester include: Blood tests that check for low iron levels (anemia). Fetal testing to check the health, activity level, and growth of the fetus. Testing is done if you have certain medical conditions or if there are problems during the pregnancy. FALSE LABOR You may feel small, irregular contractions that eventually go away. These are called Braxton Hicks contractions, or false labor. Contractions may last for hours, days, or even weeks before true labor sets in. If contractions come at regular intervals, intensify, or become painful, it is best to be seen by your caregiver.  SIGNS OF LABOR  Menstrual-like cramps. Contractions that are 5 minutes apart or less. Contractions that start on the top of the uterus and spread down to the lower abdomen and back. A sense of increased pelvic pressure or back pain. A watery or bloody mucus discharge that comes from the vagina. If you have any of these signs before the 37th week of pregnancy, call your caregiver right away. You need to go to the hospital to get checked immediately. HOME CARE INSTRUCTIONS  Avoid all smoking, herbs, alcohol, and unprescribed drugs. These chemicals affect the formation and growth of the baby. Follow your caregiver's instructions regarding medicine use. There are medicines that are either safe or unsafe to take during pregnancy. Exercise only as directed by your caregiver. Experiencing uterine cramps is a good sign to  stop exercising. Continue to eat regular, healthy meals. Wear a good support bra for breast tenderness. Do not use hot tubs, steam rooms, or saunas. Wear your seat belt at all times when driving. Avoid raw meat, uncooked cheese, cat litter boxes, and soil used by cats. These carry germs that can cause birth defects in the baby. Take your prenatal vitamins. Try taking a stool softener (if your caregiver approves) if you develop constipation. Eat more high-fiber foods, such as fresh vegetables or fruit and whole grains. Drink plenty of fluids to keep your urine clear or pale yellow. Take warm sitz baths to soothe any pain or discomfort caused by hemorrhoids. Use hemorrhoid cream if   your caregiver approves. If you develop varicose veins, wear support hose. Elevate your feet for 15 minutes, 3-4 times a day. Limit salt in your diet. Avoid heavy lifting, wear low heal shoes, and practice good posture. Rest a lot with your legs elevated if you have leg cramps or low back pain. Visit your dentist if you have not gone during your pregnancy. Use a soft toothbrush to brush your teeth and be gentle when you floss. A sexual relationship may be continued unless your caregiver directs you otherwise. Do not travel far distances unless it is absolutely necessary and only with the approval of your caregiver. Take prenatal classes to understand, practice, and ask questions about the labor and delivery. Make a trial run to the hospital. Pack your hospital bag. Prepare the baby's nursery. Continue to go to all your prenatal visits as directed by your caregiver. SEEK MEDICAL CARE IF: You are unsure if you are in labor or if your water has broken. You have dizziness. You have mild pelvic cramps, pelvic pressure, or nagging pain in your abdominal area. You have persistent nausea, vomiting, or diarrhea. You have a bad smelling vaginal discharge. You have pain with urination. SEEK IMMEDIATE MEDICAL CARE IF:  You  have a fever. You are leaking fluid from your vagina. You have spotting or bleeding from your vagina. You have severe abdominal cramping or pain. You have rapid weight loss or gain. You have shortness of breath with chest pain. You notice sudden or extreme swelling of your face, hands, ankles, feet, or legs. You have not felt your baby move in over an hour. You have severe headaches that do not go away with medicine. You have vision changes. Document Released: 06/16/2001 Document Revised: 06/27/2013 Document Reviewed: 08/23/2012 [facility] Patient Information 2015 [place], [place]. This information is not intended to replace advice given to you by your health care provider. Make sure you discuss any questions you have with your health care provider.

## 2022-11-04 NOTE — Progress Notes (Signed)
   LOW-RISK PREGNANCY VISIT Patient name: Dawn Blankenship MRN [id number]  Date of birth: 07-30-2000 Chief Complaint:   Routine Prenatal Visit (PN2/ tdap information given)  History of Present Illness:   Dawn Blankenship is a 22 y.o. G18P1001 female at [redacted]w[redacted]d with an Estimated Date of Delivery: 01/28/23 being seen today for ongoing management of a low-risk pregnancy.  Today she reports  some BLE swelling . Contractions: Not present.  .  Movement: Present. denies leaking of fluid. Review of Systems:   Pertinent items are noted in HPI Denies abnormal vaginal discharge w/ itching/odor/irritation, headaches, visual changes, shortness of breath, chest pain, abdominal pain, severe nausea/vomiting, or problems with urination or bowel movements unless otherwise stated above. Pertinent History Reviewed:  Reviewed past medical,surgical, social, obstetrical and family history.  Reviewed problem list, medications and allergies. Physical Assessment:   Vitals:   11/04/22 0903  BP: 127/67  Pulse: 87  Weight: 208 lb (94.3 kg)  Body mass index is 35.7 kg/m.        Physical Examination:   General appearance: Well appearing, and in no distress  Mental status: Alert, oriented to person, place, and time  Skin: Warm & dry  Cardiovascular: Normal heart rate noted  Respiratory: Normal respiratory effort, no distress  Abdomen: Soft, gravid, nontender  Pelvic: Cervical exam deferred         Extremities: Edema: None  Fetal Status: Fetal Heart Rate (bpm): 138 Fundal Height: 29 cm Movement: Present    No results found for this or any previous visit (from the past 24 hour(s)).  Assessment & Plan:  1) Low-risk pregnancy G2P1001 at [redacted]w[redacted]d with an Estimated Date of Delivery: 01/28/23   2) BLE swelling, tips given  3) Hx gHTN, BPs good so far; taking bASA  4) Bilat RPD, f/u u/s scheduled today for 32wks   Meds: No orders of the defined types were placed in this encounter.  Labs/procedures today:  PN2; Tdap info given  Plan:  Continue routine obstetrical care   Reviewed: Preterm labor symptoms and general obstetric precautions including but not limited to vaginal bleeding, contractions, leaking of fluid and fetal movement were reviewed in detail with the patient.  All questions were answered. Has home bp cuff. Check bp weekly, let us know if >140/90.   Follow-up: Return in about 3 weeks (around 11/25/2022) for 3wk LROB MyChart or in person; 5 wk LROB w f/u u/s for RPD.  Orders Placed This Encounter  Procedures   US OB Follow Up   Arabella Merles [facility] 11/04/2022 9:46 AM

## 2022-11-05 LAB — CBC
Hematocrit: 33.9 % — ABNORMAL LOW (ref 34.0–46.6)
Hemoglobin: 11 g/dL — ABNORMAL LOW (ref 11.1–15.9)
MCH: 28.4 pg (ref 26.6–33.0)
MCHC: 32.4 g/dL (ref 31.5–35.7)
MCV: 88 fL (ref 79–97)
Platelets: 275 10*3/uL (ref 150–450)
RBC: 3.87 x10E6/uL (ref 3.77–5.28)
RDW: 12.9 % (ref 11.7–15.4)
WBC: 12.1 10*3/uL — ABNORMAL HIGH (ref 3.4–10.8)

## 2022-11-05 LAB — GLUCOSE TOLERANCE, 2 HOURS W/ 1HR
Glucose, 1 hour: 125 mg/dL (ref 70–179)
Glucose, 2 hour: 116 mg/dL (ref 70–152)
Glucose, Fasting: 83 mg/dL (ref 70–91)

## 2022-11-05 LAB — HIV ANTIBODY (ROUTINE TESTING W REFLEX): HIV Screen 4th Generation wRfx: NONREACTIVE

## 2022-11-05 LAB — ANTIBODY SCREEN: Antibody Screen: NEGATIVE

## 2022-11-05 LAB — RPR: RPR Ser Ql: NONREACTIVE

## 2022-11-25 ENCOUNTER — Telehealth (INDEPENDENT_AMBULATORY_CARE_PROVIDER_SITE_OTHER): Payer: Medicaid Other | Admitting: Advanced Practice Midwife

## 2022-11-25 ENCOUNTER — Encounter: Payer: Self-pay | Admitting: Advanced Practice Midwife

## 2022-11-25 VITALS — BP 122/66 | HR 69 | Wt 210.0 lb

## 2022-11-25 DIAGNOSIS — Z3480 Encounter for supervision of other normal pregnancy, unspecified trimester: Secondary | ICD-10-CM

## 2022-11-25 DIAGNOSIS — Z3483 Encounter for supervision of other normal pregnancy, third trimester: Secondary | ICD-10-CM

## 2022-11-25 DIAGNOSIS — Z3A30 30 weeks gestation of pregnancy: Secondary | ICD-10-CM

## 2022-11-25 NOTE — Progress Notes (Signed)
TELEHEALTH VIRTUAL OBSTETRICS VISIT ENCOUNTER NOTE Patient name: Dawn Blankenship MRN [id number]  Date of birth: Nov 19, 2000  I connected with patient on 11/25/22 at 10:50 AM EDT by MyChart video and verified that I am speaking with the correct person using two identifiers. Due to COVID-19 recommendations, pt is not currently in our office> she is at home; provider is in the office.    I discussed the limitations, risks, security and privacy concerns of performing an evaluation and management service by telephone and the availability of in person appointments. I also discussed with the patient that there may be a patient responsible charge related to this service. The patient expressed understanding and agreed to proceed.  Chief Complaint:   Routine Prenatal Visit (My chart visit)  History of Present Illness:   Dawn Blankenship is a 22 y.o. G36P1001 female at [redacted]w[redacted]d with an Estimated Date of Delivery: 01/28/23 being evaluated today for ongoing management of a low-risk pregnancy.     11/04/2022    9:23 AM 07/22/2022   11:54 AM 02/24/2021    9:12 AM 10/23/2020   11:05 AM 09/13/2020   11:00 AM  Depression screen PHQ 2/9  Decreased Interest 0 0 0 0 1  Down, Depressed, Hopeless 0 0 0 1 0  PHQ - 2 Score 0 0 0 1 1  Altered sleeping 0 1 1 1 1   Tired, decreased energy 0 1 0 1 1  Change in appetite 0 0 0 0 0  Feeling bad or failure about yourself  0 0 0 1 0  Trouble concentrating 0 0 0 1 1  Moving slowly or fidgety/restless 0 0 0 0 0  Suicidal thoughts 0 0 0 0 0  PHQ-9 Score 0 2 1 5 4     Today she reports  BLE edema better; having RUQ pain esp after dairy/fried foods . Contractions: Not present.  .  Movement: Present. denies leaking of fluid. Review of Systems:   Pertinent items are noted in HPI Denies abnormal vaginal discharge w/ itching/odor/irritation, headaches, visual changes, shortness of breath, chest pain, abdominal pain, severe nausea/vomiting, or problems with urination or bowel  movements unless otherwise stated above. Pertinent History Reviewed:  Reviewed past medical,surgical, social, obstetrical and family history.  Reviewed problem list, medications and allergies. Physical Assessment:   Vitals:   11/25/22 1057  BP: 122/66  Pulse: 69  Weight: 210 lb (95.3 kg)  Body mass index is 36.05 kg/m.        Physical Examination:   General:  Alert, oriented and cooperative.   Mental Status: Normal mood and affect perceived. Normal judgment and thought content.  Rest of physical exam deferred due to type of encounter  No results found for this or any previous visit (from the past 24 hour(s)).  Assessment & Plan:  1) Pregnancy G2P1001 at [redacted]w[redacted]d with an Estimated Date of Delivery: 01/28/23   2) RUQ pain, some nausea as well; seems to be r/t fried food/dairy; recommend looking up bland diet for GB and keeping a diary of pain frequency and recent food eaten to bring to next visit; increase water  3) Bilat RPD, has f/u u/s with next visit  4) Hx gHTN, taking bASA; nl BPs   Meds: No orders of the defined types were placed in this encounter.   Labs/procedures today: none  Plan:  Continue routine obstetrical care.  Has home bp cuff.  Check bp weekly, let us know if >140/90.  Next visit: prefers will be in person  for f/u RPD ultrasound     Reviewed: Preterm labor symptoms and general obstetric precautions including but not limited to vaginal bleeding, contractions, leaking of fluid and fetal movement were reviewed in detail with the patient. The patient was advised to call back or seek an in-person office evaluation/go to [facility] at [facility] for any urgent or concerning symptoms. All questions were answered. Please refer to After Visit Summary for other counseling recommendations.    I provided 8 minutes of non-face-to-face time during this encounter.  Follow-up: Return for As scheduled.  No orders of the defined types were placed in this  encounter.  Arabella Merles CNM 11/25/2022 11:13 AM

## 2022-12-09 ENCOUNTER — Encounter: Payer: Medicaid Other | Admitting: Obstetrics & Gynecology

## 2022-12-09 ENCOUNTER — Other Ambulatory Visit: Payer: Medicaid Other

## 2022-12-10 ENCOUNTER — Encounter: Payer: Self-pay | Admitting: Advanced Practice Midwife

## 2022-12-10 ENCOUNTER — Ambulatory Visit (INDEPENDENT_AMBULATORY_CARE_PROVIDER_SITE_OTHER): Payer: Medicaid Other

## 2022-12-10 ENCOUNTER — Ambulatory Visit (INDEPENDENT_AMBULATORY_CARE_PROVIDER_SITE_OTHER): Payer: Medicaid Other | Admitting: Advanced Practice Midwife

## 2022-12-10 ENCOUNTER — Encounter: Payer: Medicaid Other | Admitting: Obstetrics & Gynecology

## 2022-12-10 VITALS — BP 138/76 | HR 93 | Wt 210.0 lb

## 2022-12-10 DIAGNOSIS — Z3A33 33 weeks gestation of pregnancy: Secondary | ICD-10-CM | POA: Diagnosis not present

## 2022-12-10 DIAGNOSIS — O35EXX0 Maternal care for other (suspected) fetal abnormality and damage, fetal genitourinary anomalies, not applicable or unspecified: Secondary | ICD-10-CM | POA: Diagnosis not present

## 2022-12-10 DIAGNOSIS — Z3480 Encounter for supervision of other normal pregnancy, unspecified trimester: Secondary | ICD-10-CM

## 2022-12-10 NOTE — Patient Instructions (Signed)
Dawn Blankenship, thank you for choosing our office today! We appreciate the opportunity to meet your healthcare needs. You may receive a short survey by mail, e-mail, or through MyChart. If you are happy with your care we would appreciate if you could take just a few minutes to complete the survey questions. We read all of your comments and take your feedback very seriously. Thank you again for choosing our office.  [facility] [place] [facility] ([address] [place], [place] [address]) Entrance C, located off of [address] Free 24/7 valet parking   CLASSES: Go to [email or web address] to register for classes (childbirth, breastfeeding, waterbirth, infant CPR, daddy bootcamp, etc.)  Call the office ([phone]) or go to [facility] if: You begin to have strong, frequent contractions Your water breaks.  Sometimes it is a big gush of fluid, sometimes it is just a trickle that keeps getting your panties wet or running down your legs You have vaginal bleeding.  It is normal to have a small amount of spotting if your cervix was checked.  You don't feel your baby moving like normal.  If you don't, get you something to eat and drink and lay down and focus on feeling your baby move.   If your baby is still not moving like normal, you should call the office or go to [facility].  Call the office ([phone]) or go to [facility] for these signs of pre-eclampsia: Severe headache that does not go away with Tylenol Visual changes- seeing spots, double, blurred vision Pain under your right breast or upper abdomen that does not go away with Tums or heartburn medicine Nausea and/or vomiting Severe swelling in your hands, feet, and face   Tdap Vaccine It is recommended that you get the Tdap vaccine during the third trimester of EACH pregnancy to help protect your baby from getting pertussis (whooping cough) 27-36 weeks is the BEST time to do  this so that you can pass the protection on to your baby. During pregnancy is better than after pregnancy, but if you are unable to get it during pregnancy it will be offered at the hospital.  You can get this vaccine with us, at the health department, your family doctor, or some local pharmacies Everyone who will be around your baby should also be up-to-date on their vaccines before the baby comes. Adults (who are not pregnant) only need 1 dose of Tdap during adulthood.   [place] Pediatricians/Family Doctors [facility] ([facility]): [address], [phone]           [facility]: [address], [phone]                [facility] ([facility]): [address], [phone] (call to ask if accepting patients) [facility]: [address], [place], [phone]    [place] Pediatricians/Family Doctors [facility] ([facility]): [address], [phone] [facility]: [address], [phone] [facility]: [address], [phone]  [place] [facility]  [facility] ([facility]): [phone] [facility]: [address], [phone]   [place] Family [facility]: [address], [phone]  [place] [facility]  [facility]: [address], [phone]  Home Blood Pressure Monitoring for Patients   Your provider has recommended that you check your   blood pressure (BP) at least once a week at home. If you do not have a blood pressure cuff at home, one will be provided for you. Contact your provider if you have not received your monitor within 1 week.   Helpful Tips for Accurate Home Blood Pressure Checks  Don't smoke, exercise, or drink caffeine 30 minutes before checking your BP Use the restroom before checking your BP (a full bladder can raise your  pressure) Relax in a comfortable upright chair Feet on the ground Left arm resting comfortably on a flat surface at the level of your heart Legs uncrossed Back supported Sit quietly and don't talk Place the cuff on your bare arm Adjust snuggly, so that only two fingertips can fit between your skin and the top of the cuff Check 2 readings separated by at least one minute Keep a log of your BP readings For a visual, please reference this diagram: http://ccnc.care/bpdiagram  Provider Name: [facility]     Phone: [phone]  Zone 1: ALL CLEAR  Continue to monitor your symptoms:  BP reading is less than 140 (top number) or less than 90 (bottom number)  No right upper stomach pain No headaches or seeing spots No feeling nauseated or throwing up No swelling in face and hands  Zone 2: CAUTION Call your doctor's office for any of the following:  BP reading is greater than 140 (top number) or greater than 90 (bottom number)  Stomach pain under your ribs in the middle or right side Headaches or seeing spots Feeling nauseated or throwing up Swelling in face and hands  Zone 3: EMERGENCY  Seek immediate medical care if you have any of the following:  BP reading is greater than160 (top number) or greater than 110 (bottom number) Severe headaches not improving with Tylenol Serious difficulty catching your breath Any worsening symptoms from Zone 2   Third Trimester of Pregnancy The third trimester is from week 29 through week 42, months 7 through 9. The third trimester is a time when the fetus is growing rapidly. At the end of the ninth month, the fetus is about 20 inches in length and weighs 6-10 pounds.  BODY CHANGES Your body goes through many changes during pregnancy. The changes vary from woman to woman.  Your weight will continue to increase. You can expect to gain 25-35 pounds (11-16 kg) by the end of the pregnancy. You may begin to get stretch marks on your hips, abdomen,  and breasts. You may urinate more often because the fetus is moving lower into your pelvis and pressing on your bladder. You may develop or continue to have heartburn as a result of your pregnancy. You may develop constipation because certain hormones are causing the muscles that push waste through your intestines to slow down. You may develop hemorrhoids or swollen, bulging veins (varicose veins). You may have pelvic pain because of the weight gain and pregnancy hormones relaxing your joints between the bones in your pelvis. Backaches may result from overexertion of the muscles supporting your posture. You may have changes in your hair. These can include thickening of your hair, rapid growth, and changes in texture. Some women also have hair loss during or after pregnancy, or hair that feels dry or thin. Your hair will most likely return to normal after your baby is born. Your breasts will continue to grow and be tender. A yellow discharge may leak from your breasts called colostrum. Your belly button may stick out. You may   feel short of breath because of your expanding uterus. You may notice the fetus "dropping," or moving lower in your abdomen. You may have a bloody mucus discharge. This usually occurs a few days to a week before labor begins. Your cervix becomes thin and soft (effaced) near your due date. WHAT TO EXPECT AT YOUR PRENATAL EXAMS  You will have prenatal exams every 2 weeks until week 36. Then, you will have weekly prenatal exams. During a routine prenatal visit: You will be weighed to make sure you and the fetus are growing normally. Your blood pressure is taken. Your abdomen will be measured to track your baby's growth. The fetal heartbeat will be listened to. Any test results from the previous visit will be discussed. You may have a cervical check near your due date to see if you have effaced. At around 36 weeks, your caregiver will check your cervix. At the same time, your  caregiver will also perform a test on the secretions of the vaginal tissue. This test is to determine if a type of bacteria, Group B streptococcus, is present. Your caregiver will explain this further. Your caregiver may ask you: What your birth plan is. How you are feeling. If you are feeling the baby move. If you have had any abnormal symptoms, such as leaking fluid, bleeding, severe headaches, or abdominal cramping. If you have any questions. Other tests or screenings that may be performed during your third trimester include: Blood tests that check for low iron levels (anemia). Fetal testing to check the health, activity level, and growth of the fetus. Testing is done if you have certain medical conditions or if there are problems during the pregnancy. FALSE LABOR You may feel small, irregular contractions that eventually go away. These are called Braxton Hicks contractions, or false labor. Contractions may last for hours, days, or even weeks before true labor sets in. If contractions come at regular intervals, intensify, or become painful, it is best to be seen by your caregiver.  SIGNS OF LABOR  Menstrual-like cramps. Contractions that are 5 minutes apart or less. Contractions that start on the top of the uterus and spread down to the lower abdomen and back. A sense of increased pelvic pressure or back pain. A watery or bloody mucus discharge that comes from the vagina. If you have any of these signs before the 37th week of pregnancy, call your caregiver right away. You need to go to the hospital to get checked immediately. HOME CARE INSTRUCTIONS  Avoid all smoking, herbs, alcohol, and unprescribed drugs. These chemicals affect the formation and growth of the baby. Follow your caregiver's instructions regarding medicine use. There are medicines that are either safe or unsafe to take during pregnancy. Exercise only as directed by your caregiver. Experiencing uterine cramps is a good sign to  stop exercising. Continue to eat regular, healthy meals. Wear a good support bra for breast tenderness. Do not use hot tubs, steam rooms, or saunas. Wear your seat belt at all times when driving. Avoid raw meat, uncooked cheese, cat litter boxes, and soil used by cats. These carry germs that can cause birth defects in the baby. Take your prenatal vitamins. Try taking a stool softener (if your caregiver approves) if you develop constipation. Eat more high-fiber foods, such as fresh vegetables or fruit and whole grains. Drink plenty of fluids to keep your urine clear or pale yellow. Take warm sitz baths to soothe any pain or discomfort caused by hemorrhoids. Use hemorrhoid cream if   your caregiver approves. If you develop varicose veins, wear support hose. Elevate your feet for 15 minutes, 3-4 times a day. Limit salt in your diet. Avoid heavy lifting, wear low heal shoes, and practice good posture. Rest a lot with your legs elevated if you have leg cramps or low back pain. Visit your dentist if you have not gone during your pregnancy. Use a soft toothbrush to brush your teeth and be gentle when you floss. A sexual relationship may be continued unless your caregiver directs you otherwise. Do not travel far distances unless it is absolutely necessary and only with the approval of your caregiver. Take prenatal classes to understand, practice, and ask questions about the labor and delivery. Make a trial run to the hospital. Pack your hospital bag. Prepare the baby's nursery. Continue to go to all your prenatal visits as directed by your caregiver. SEEK MEDICAL CARE IF: You are unsure if you are in labor or if your water has broken. You have dizziness. You have mild pelvic cramps, pelvic pressure, or nagging pain in your abdominal area. You have persistent nausea, vomiting, or diarrhea. You have a bad smelling vaginal discharge. You have pain with urination. SEEK IMMEDIATE MEDICAL CARE IF:  You  have a fever. You are leaking fluid from your vagina. You have spotting or bleeding from your vagina. You have severe abdominal cramping or pain. You have rapid weight loss or gain. You have shortness of breath with chest pain. You notice sudden or extreme swelling of your face, hands, ankles, feet, or legs. You have not felt your baby move in over an hour. You have severe headaches that do not go away with medicine. You have vision changes. Document Released: 06/16/2001 Document Revised: 06/27/2013 Document Reviewed: 08/23/2012 ExitCare Patient Information 2015 [employer]. This information is not intended to replace advice given to you by your health care provider. Make sure you discuss any questions you have with your health care provider.       

## 2022-12-10 NOTE — Progress Notes (Signed)
[place] 33 wks,cephalic,fundal placenta gr 1,FHR 137 bpm,left hydronephrosis,left renal pelvis 10 mm,mild right renal pelvic dilatation 7.5 mm,AFI 13 cm,EFW 2294 g 68%

## 2022-12-10 NOTE — Progress Notes (Signed)
   LOW-RISK PREGNANCY VISIT Patient name: Dawn Blankenship MRN [id number]  Date of birth: 21-May-2001 Chief Complaint:   Routine Prenatal Visit  History of Present Illness:   Dawn Blankenship is a 22 y.o. G35P1001 female at [redacted]w[redacted]d with an Estimated Date of Delivery: 01/28/23 being seen today for ongoing management of a low-risk pregnancy.  Today she reports  less RUQ pain with decreasing fatty foods; some burning at top of abd . Contractions: Not present. Vag. Bleeding: None.  Movement: Present. denies leaking of fluid. Review of Systems:   Pertinent items are noted in HPI Denies abnormal vaginal discharge w/ itching/odor/irritation, headaches, visual changes, shortness of breath, chest pain, abdominal pain, severe nausea/vomiting, or problems with urination or bowel movements unless otherwise stated above. Pertinent History Reviewed:  Reviewed past medical,surgical, social, obstetrical and family history.  Reviewed problem list, medications and allergies. Physical Assessment:   Vitals:   12/10/22 1426  BP: 138/76  Pulse: 93  Weight: 210 lb (95.3 kg)  Body mass index is 36.05 kg/m.        Physical Examination:   General appearance: Well appearing, and in no distress  Mental status: Alert, oriented to person, place, and time  Skin: Warm & dry  Cardiovascular: Normal heart rate noted  Respiratory: Normal respiratory effort, no distress  Abdomen: Soft, gravid, nontender  Pelvic: Cervical exam deferred         Extremities: Edema: None  Fetal Status: Fetal Heart Rate (bpm): 137 u/s   Movement: Present    F/U bilat RPD: [place] 33 wks,cephalic,fundal placenta gr 1,FHR 137 bpm,left hydronephrosis,left renal pelvis 10 mm,mild right renal pelvic dilatation 7.5 mm,AFI 13 cm,EFW 2294 g 68%   No results found for this or any previous visit (from the past 24 hour(s)).  Assessment & Plan:  1) Low-risk pregnancy G2P1001 at [redacted]w[redacted]d with an Estimated Date of Delivery: 01/28/23   2) Hx gHTN,  higher but nl today; good values at home  3) Bilat RPD, 7.56mm and 10mm; will have peds eval after birth   Meds: No orders of the defined types were placed in this encounter.  Labs/procedures today: f/u ultrasound  Plan:  Continue routine obstetrical care   Reviewed: Preterm labor symptoms and general obstetric precautions including but not limited to vaginal bleeding, contractions, leaking of fluid and fetal movement were reviewed in detail with the patient.  All questions were answered. Has home bp cuff. Check bp weekly, let us know if >140/90.   Follow-up: Return in about 2 weeks (around 12/24/2022) for LROB, MyChart video.  No orders of the defined types were placed in this encounter.  Arabella Merles CNM 12/10/2022 2:47 PM

## 2022-12-25 ENCOUNTER — Encounter: Payer: Self-pay | Admitting: Obstetrics & Gynecology

## 2022-12-25 ENCOUNTER — Telehealth (INDEPENDENT_AMBULATORY_CARE_PROVIDER_SITE_OTHER): Payer: Medicaid Other | Admitting: Obstetrics & Gynecology

## 2022-12-25 VITALS — BP 128/72 | HR 84

## 2022-12-25 DIAGNOSIS — Z3A35 35 weeks gestation of pregnancy: Secondary | ICD-10-CM

## 2022-12-25 DIAGNOSIS — Z3480 Encounter for supervision of other normal pregnancy, unspecified trimester: Secondary | ICD-10-CM

## 2022-12-25 DIAGNOSIS — Z3483 Encounter for supervision of other normal pregnancy, third trimester: Secondary | ICD-10-CM

## 2022-12-25 NOTE — Progress Notes (Unsigned)
I connected with Dawn Blankenship 12/27/22 at 10:10 AM EDT by: MyChart video and verified that I am speaking with the correct person using two identifiers.  Patient is located at home and provider is located at office.     I discussed the limitations, risks, security and privacy concerns of performing an evaluation and management service by MyChart video and the availability of in person appointments. I also discussed with the patient that there may be a patient responsible charge related to this service. By engaging in this virtual visit, you consent to the provision of healthcare.  Additionally, you authorize for your insurance to be billed for the services provided during this visit.  The patient expressed understanding and agreed to proceed.  The following staff members participated in the virtual visit:  Faith Rogue    PRENATAL VISIT NOTE  Subjective:  Dawn Blankenship is a 22 y.o. G2P1001 at [redacted]w[redacted]d  for virtual visit for ongoing prenatal care.  She is currently monitored for the following issues for this low-risk pregnancy and has History of gestational hypertension; Encounter for supervision of normal pregnancy, antepartum; and Ultrasound recheck of fetal pyelectasis, antepartum on their problem list.  Patient reports no complaints.  Contractions: Not present. Vag. Bleeding: None.  Movement: Present. Denies leaking of fluid.   The following portions of the patient's history were reviewed and updated as appropriate: allergies, current medications, past family history, past medical history, past social history, past surgical history and problem list.   Objective:   Vitals:   12/25/22 0953  BP: 128/72  Pulse: 84   Self-Obtained  Fetal Status:     Movement: Present     Assessment and Plan:  Pregnancy: G2P1001 at [redacted]w[redacted]d There are no diagnoses linked to this encounter. Preterm labor symptoms and general obstetric precautions including but not limited to vaginal bleeding,  contractions, leaking of fluid and fetal movement were reviewed in detail with the patient.  Return for keep scheduled.  Future Appointments  Date Time Provider Department Center  12/31/2022  4:10 PM Jacklyn Shell, [place] [facility]  01/08/2023 11:30 AM Sue Lush, FNP [facility]  01/14/2023  4:10 PM Jacklyn Shell, CNM [facility]  01/21/2023  4:10 PM Jacklyn Shell, CNM [facility]  01/28/2023  4:10 PM Cresenzo-Dishmon, Scarlette Calico, CNM [facility]     Time spent on virtual visit: 10 minutes  Lazaro Arms, MD

## 2022-12-31 ENCOUNTER — Encounter: Payer: Medicaid Other | Admitting: Advanced Practice Midwife

## 2023-01-08 ENCOUNTER — Encounter: Payer: Self-pay | Admitting: Obstetrics and Gynecology

## 2023-01-08 ENCOUNTER — Ambulatory Visit (INDEPENDENT_AMBULATORY_CARE_PROVIDER_SITE_OTHER): Payer: Medicaid Other | Admitting: Obstetrics and Gynecology

## 2023-01-08 ENCOUNTER — Other Ambulatory Visit (HOSPITAL_COMMUNITY)
Admission: RE | Admit: 2023-01-08 | Discharge: 2023-01-08 | Disposition: A | Discharge status: Home / Self Care | Payer: Medicaid Other | Source: Ambulatory Visit | Attending: Obstetrics and Gynecology | Admitting: Obstetrics and Gynecology

## 2023-01-08 VITALS — BP 132/85 | HR 117 | Wt 214.2 lb

## 2023-01-08 DIAGNOSIS — Z3483 Encounter for supervision of other normal pregnancy, third trimester: Secondary | ICD-10-CM | POA: Insufficient documentation

## 2023-01-08 DIAGNOSIS — Z3A37 37 weeks gestation of pregnancy: Secondary | ICD-10-CM | POA: Diagnosis present

## 2023-01-08 DIAGNOSIS — Z8759 Personal history of other complications of pregnancy, childbirth and the puerperium: Secondary | ICD-10-CM

## 2023-01-08 DIAGNOSIS — O35EXX0 Maternal care for other (suspected) fetal abnormality and damage, fetal genitourinary anomalies, not applicable or unspecified: Secondary | ICD-10-CM

## 2023-01-08 DIAGNOSIS — Z3480 Encounter for supervision of other normal pregnancy, unspecified trimester: Secondary | ICD-10-CM

## 2023-01-08 NOTE — Progress Notes (Signed)
   PRENATAL VISIT NOTE  Subjective:  Dawn Blankenship is a 22 y.o. G2P1001 at [redacted]w[redacted]d being seen today for ongoing prenatal care.  She is currently monitored for the following issues for this low-risk pregnancy and has History of gestational hypertension; Encounter for supervision of normal pregnancy, antepartum; and Ultrasound recheck of fetal pyelectasis, antepartum on their problem list.  Patient reports no complaints.  Contractions: Not present.  .  Movement: Present. Denies leaking of fluid.   The following portions of the patient's history were reviewed and updated as appropriate: allergies, current medications, past family history, past medical history, past social history, past surgical history and problem list.   Objective:   Vitals:   01/08/23 1119  BP: (!) 141/80  Pulse: 100  Weight: 214 lb 3.2 oz (97.2 kg)    Fetal Status: Fetal Heart Rate (bpm): 147 Fundal Height: 37 cm Movement: Present  Presentation: Vertex  General:  Alert, oriented and cooperative. Patient is in no acute distress.  Skin: Skin is warm and dry. No rash noted.   Cardiovascular: Normal heart rate noted  Respiratory: Normal respiratory effort, no problems with respiration noted  Abdomen: Soft, gravid, appropriate for gestational age.  Pain/Pressure: Present     Pelvic: Cervical exam performed in the presence of a chaperone Dilation: Closed Effacement (%): Thick Station: -3  Extremities: Normal range of motion.  Edema: Trace  Mental Status: Normal mood and affect. Normal behavior. Normal judgment and thought content.   Assessment and Plan:  Pregnancy: G2P1001 at [redacted]w[redacted]d 1. Supervision of other normal pregnancy, antepartum FHR normal Feeling regular fetal movement No concerns today  2. History of gestational hypertension On ASA Elevated initial, normotensive repeat Precautions provided  3. Encounter for repeat ultrasound of fetal pyelectasis, antepartum, single or unspecified fetus Bilateral renal  pelvic dilatation, 7.102mm right, 10mm left AFI nml, EFW 68% Postnatal follow up w/ peds eval  4. [redacted] weeks gestation of pregnancy 36 week Swabs today  Desired SVE: 0/thick/-3 Labor precautions provided   Term labor symptoms and general obstetric precautions including but not limited to vaginal bleeding, contractions, leaking of fluid and fetal movement were reviewed in detail with the patient. Please refer to After Visit Summary for other counseling recommendations.   Return in one week for routine prenatal  Future Appointments  Date Time Provider Department Center  01/14/2023  4:10 PM Jacklyn Shell, CNM [facility]  01/21/2023  4:10 PM Jacklyn Shell, CNM [facility]  01/28/2023  4:10 PM Cresenzo-Dishmon, Scarlette Calico, CNM [facility]    Albertine Grates, FNP

## 2023-01-11 ENCOUNTER — Encounter: Payer: Self-pay | Admitting: Family Medicine

## 2023-01-11 LAB — CERVICOVAGINAL ANCILLARY ONLY
Chlamydia: NEGATIVE
Comment: NEGATIVE
Comment: NORMAL
Neisseria Gonorrhea: NEGATIVE

## 2023-01-12 LAB — STREP GP B SUSCEPTIBILITY

## 2023-01-12 LAB — STREP GP B NAA+RFLX: Strep Gp B NAA+Rflx: POSITIVE — AB

## 2023-01-14 ENCOUNTER — Ambulatory Visit (INDEPENDENT_AMBULATORY_CARE_PROVIDER_SITE_OTHER): Payer: Medicaid Other | Admitting: Advanced Practice Midwife

## 2023-01-14 VITALS — BP 129/83 | HR 96 | Wt 216.0 lb

## 2023-01-14 DIAGNOSIS — Z3480 Encounter for supervision of other normal pregnancy, unspecified trimester: Secondary | ICD-10-CM

## 2023-01-14 DIAGNOSIS — Z3A38 38 weeks gestation of pregnancy: Secondary | ICD-10-CM

## 2023-01-14 NOTE — Progress Notes (Signed)
   LOW-RISK PREGNANCY VISIT Patient name: Dawn Blankenship MRN [id number]  Date of birth: June 06, 2001 Chief Complaint:   Routine Prenatal Visit  History of Present Illness:   Dawn Blankenship is a 22 y.o. G67P1001 female at [redacted]w[redacted]d with an Estimated Date of Delivery: 01/28/23 being seen today for ongoing management of a low-risk pregnancy.  Today she reports normal pregnancy complaints. Contractions: Not present.  .  Movement: Present. denies leaking of fluid. Review of Systems:   Pertinent items are noted in HPI Denies abnormal vaginal discharge w/ itching/odor/irritation, headaches, visual changes, shortness of breath, chest pain, abdominal pain, severe nausea/vomiting, or problems with urination or bowel movements unless otherwise stated above. Pertinent History Reviewed:  Reviewed past medical,surgical, social, obstetrical and family history.  Reviewed problem list, medications and allergies. Physical Assessment:   Vitals:   01/14/23 1613  BP: 129/83  Pulse: 96  Weight: 216 lb (98 kg)  Body mass index is 37.08 kg/m.        Physical Examination:   General appearance: Well appearing, and in no distress  Mental status: Alert, oriented to person, place, and time  Skin: Warm & dry  Cardiovascular: Normal heart rate noted  Respiratory: Normal respiratory effort, no distress  Abdomen: Soft, gravid, nontender  Pelvic: Cervical exam performed         Extremities: Edema: Trace  Fetal Status:     Movement: Present    Chaperone:   Sheena CMA     No results found for this or any previous visit (from the past 24 hour(s)).  Assessment & Plan:    Pregnancy: G2P1001 at [redacted]w[redacted]d 1. [redacted] weeks gestation of pregnancy   2. Supervision of other normal pregnancy, antepartum      Meds: No orders of the defined types were placed in this encounter.  Labs/procedures today: none  Plan:  Continue routine obstetrical care  Next visit: prefers in person    Reviewed: Term labor symptoms  and general obstetric precautions including but not limited to vaginal bleeding, contractions, leaking of fluid and fetal movement were reviewed in detail with the patient.  All questions were answered. Has home bp cuff. Check bp weekly, let us know if >140/90.   Follow-up: Return for As scheduled.  Future Appointments  Date Time Provider Department Center  01/21/2023  4:10 PM Jacklyn Shell, CNM [facility]  01/28/2023  4:10 PM Cresenzo-Dishmon, Scarlette Calico, CNM [facility]    No orders of the defined types were placed in this encounter.  Jacklyn Shell DNP, CNM 01/14/2023 4:40 PM --

## 2023-01-14 NOTE — Patient Instructions (Signed)

## 2023-01-21 ENCOUNTER — Encounter: Payer: Self-pay | Admitting: Advanced Practice Midwife

## 2023-01-21 ENCOUNTER — Ambulatory Visit: Payer: Medicaid Other | Admitting: Advanced Practice Midwife

## 2023-01-21 VITALS — BP 136/71 | HR 76 | Wt 218.0 lb

## 2023-01-21 DIAGNOSIS — Z3483 Encounter for supervision of other normal pregnancy, third trimester: Secondary | ICD-10-CM

## 2023-01-21 DIAGNOSIS — Z3A39 39 weeks gestation of pregnancy: Secondary | ICD-10-CM

## 2023-01-21 DIAGNOSIS — Z3480 Encounter for supervision of other normal pregnancy, unspecified trimester: Secondary | ICD-10-CM

## 2023-01-21 NOTE — Progress Notes (Signed)
   LOW-RISK PREGNANCY VISIT Patient name: Dawn Blankenship MRN [id number]  Date of birth: [birth year] Chief Complaint:   Routine Prenatal Visit (Cervical check)  History of Present Illness:   Dawn Blankenship is a 22 y.o. G71P1001 female at [redacted]w[redacted]d with an Estimated Date of Delivery: 01/28/23 being seen today for ongoing management of a low-risk pregnancy.  Today she reports no complaints. Contractions: Not present.  .  Movement: Present. denies leaking of fluid. Review of Systems:   Pertinent items are noted in HPI Denies abnormal vaginal discharge w/ itching/odor/irritation, headaches, visual changes, shortness of breath, chest pain, abdominal pain, severe nausea/vomiting, or problems with urination or bowel movements unless otherwise stated above. Pertinent History Reviewed:  Reviewed past medical,surgical, social, obstetrical and family history.  Reviewed problem list, medications and allergies. Physical Assessment:   Vitals:   01/21/23 1618  BP: 136/71  Pulse: 76  Weight: 218 lb (98.9 kg)  Body mass index is 37.42 kg/m.        Physical Examination:   General appearance: Well appearing, and in no distress  Mental status: Alert, oriented to person, place, and time  Skin: Warm & dry  Cardiovascular: Normal heart rate noted  Respiratory: Normal respiratory effort, no distress  Abdomen: Soft, gravid, nontender  Pelvic: Cervical exam performed  Dilation: 1.5 Effacement (%): Thick Station: -2  Extremities: Edema: Trace  Fetal Status:   Fundal Height: 39 cm Movement: Present Presentation: Vertex  Chaperone:  Peggy Dones    No results found for this or any previous visit (from the past 24 hour(s)).  Assessment & Plan:    Pregnancy: G2P1001 at [redacted]w[redacted]d 1. Supervision of other normal pregnancy, antepartum      Meds: No orders of the defined types were placed in this encounter.  Labs/procedures today:   Plan:  Continue routine obstetrical care  Next visit: prefers in  person    Reviewed: Term labor symptoms and general obstetric precautions including but not limited to vaginal bleeding, contractions, leaking of fluid and fetal movement were reviewed in detail with the patient.  All questions were answered. Has home bp cuff. . Check bp weekly, let us know if >140/90.   Follow-up: Return for come in 3:45 for NST next Thursay.  Future Appointments  Date Time Provider Department Center  01/28/2023  4:10 PM Cresenzo-Dishmon, Scarlette Calico, CNM [facility]    No orders of the defined types were placed in this encounter.  Jacklyn Shell DNP, CNM 01/21/2023 4:57 PM

## 2023-01-21 NOTE — Patient Instructions (Signed)

## 2023-01-26 ENCOUNTER — Encounter: Payer: Self-pay | Admitting: Advanced Practice Midwife

## 2023-01-28 ENCOUNTER — Encounter: Payer: Self-pay | Admitting: Advanced Practice Midwife

## 2023-01-28 ENCOUNTER — Inpatient Hospital Stay (HOSPITAL_COMMUNITY)
Admission: AD | Admit: 2023-01-28 | Discharge: 2023-01-31 | DRG: 807 | Disposition: A | Discharge status: Home / Self Care | Payer: Medicaid Other | Attending: Family Medicine | Admitting: Family Medicine

## 2023-01-28 ENCOUNTER — Ambulatory Visit (INDEPENDENT_AMBULATORY_CARE_PROVIDER_SITE_OTHER): Payer: Medicaid Other | Admitting: Advanced Practice Midwife

## 2023-01-28 ENCOUNTER — Other Ambulatory Visit: Payer: Medicaid Other

## 2023-01-28 ENCOUNTER — Encounter (HOSPITAL_COMMUNITY): Payer: Self-pay | Admitting: Obstetrics & Gynecology

## 2023-01-28 VITALS — BP 138/79 | Wt 218.4 lb

## 2023-01-28 DIAGNOSIS — O480 Post-term pregnancy: Secondary | ICD-10-CM

## 2023-01-28 DIAGNOSIS — Z3A40 40 weeks gestation of pregnancy: Secondary | ICD-10-CM | POA: Diagnosis not present

## 2023-01-28 DIAGNOSIS — O134 Gestational [pregnancy-induced] hypertension without significant proteinuria, complicating childbirth: Secondary | ICD-10-CM | POA: Diagnosis present

## 2023-01-28 DIAGNOSIS — Z87891 Personal history of nicotine dependence: Secondary | ICD-10-CM | POA: Diagnosis not present

## 2023-01-28 DIAGNOSIS — Z3480 Encounter for supervision of other normal pregnancy, unspecified trimester: Secondary | ICD-10-CM

## 2023-01-28 DIAGNOSIS — O99820 Streptococcus B carrier state complicating pregnancy: Secondary | ICD-10-CM | POA: Diagnosis not present

## 2023-01-28 DIAGNOSIS — Z7982 Long term (current) use of aspirin: Secondary | ICD-10-CM

## 2023-01-28 DIAGNOSIS — O26893 Other specified pregnancy related conditions, third trimester: Secondary | ICD-10-CM | POA: Diagnosis present

## 2023-01-28 DIAGNOSIS — O99824 Streptococcus B carrier state complicating childbirth: Secondary | ICD-10-CM | POA: Diagnosis present

## 2023-01-28 DIAGNOSIS — O139 Gestational [pregnancy-induced] hypertension without significant proteinuria, unspecified trimester: Secondary | ICD-10-CM | POA: Diagnosis present

## 2023-01-28 DIAGNOSIS — Z8759 Personal history of other complications of pregnancy, childbirth and the puerperium: Secondary | ICD-10-CM | POA: Diagnosis present

## 2023-01-28 LAB — CBC
HCT: 36.2 % (ref 36.0–46.0)
Hemoglobin: 12.1 g/dL (ref 12.0–15.0)
MCH: 28.9 pg (ref 26.0–34.0)
MCHC: 33.4 g/dL (ref 30.0–36.0)
MCV: 86.6 fL (ref 80.0–100.0)
Platelets: 307 10*3/uL (ref 150–400)
RBC: 4.18 MIL/uL (ref 3.87–5.11)
RDW: 14.6 % (ref 11.5–15.5)
WBC: 20.7 10*3/uL — ABNORMAL HIGH (ref 4.0–10.5)
nRBC: 0 % (ref 0.0–0.2)

## 2023-01-28 MED ORDER — OXYTOCIN BOLUS FROM INFUSION
333.0000 mL | Freq: Once | INTRAVENOUS | Status: AC
  Administered 2023-01-29: 333 mL via INTRAVENOUS

## 2023-01-28 MED ORDER — CEFAZOLIN SODIUM-DEXTROSE 2-4 GM/100ML-% IV SOLN
2.0000 g | Freq: Once | INTRAVENOUS | Status: AC
  Administered 2023-01-28: 2 g via INTRAVENOUS
  Filled 2023-01-28: qty 100

## 2023-01-28 MED ORDER — FLEET ENEMA 7-19 GM/118ML RE ENEM: 1.0000 | ENEMA | RECTAL | Status: DC | PRN

## 2023-01-28 MED ORDER — OXYCODONE-ACETAMINOPHEN 5-325 MG PO TABS: 2.0000 | ORAL_TABLET | ORAL | Status: DC | PRN

## 2023-01-28 MED ORDER — LACTATED RINGERS IV SOLN: INTRAVENOUS | Status: DC

## 2023-01-28 MED ORDER — OXYCODONE-ACETAMINOPHEN 5-325 MG PO TABS: 1.0000 | ORAL_TABLET | ORAL | Status: DC | PRN

## 2023-01-28 MED ORDER — HYDROXYZINE HCL 50 MG PO TABS: 50.0000 mg | ORAL_TABLET | Freq: Four times a day (QID) | ORAL | Status: DC | PRN

## 2023-01-28 MED ORDER — ACETAMINOPHEN 325 MG PO TABS: 650.0000 mg | ORAL_TABLET | ORAL | Status: DC | PRN

## 2023-01-28 MED ORDER — LACTATED RINGERS IV SOLN: 500.0000 mL | INTRAVENOUS | Status: DC | PRN

## 2023-01-28 MED ORDER — CEFAZOLIN SODIUM-DEXTROSE 1-4 GM/50ML-% IV SOLN
1.0000 g | Freq: Three times a day (TID) | INTRAVENOUS | Status: DC
  Filled 2023-01-28: qty 50

## 2023-01-28 MED ORDER — LIDOCAINE HCL (PF) 1 % IJ SOLN
30.0000 mL | INTRAMUSCULAR | Status: AC | PRN
  Administered 2023-01-29: 30 mL via SUBCUTANEOUS
  Filled 2023-01-28: qty 30

## 2023-01-28 MED ORDER — ONDANSETRON HCL 4 MG/2ML IJ SOLN: 4.0000 mg | Freq: Four times a day (QID) | INTRAMUSCULAR | Status: DC | PRN

## 2023-01-28 MED ORDER — OXYTOCIN-SODIUM CHLORIDE 30-0.9 UT/500ML-% IV SOLN
2.5000 [IU]/h | INTRAVENOUS | Status: DC
  Administered 2023-01-29: 2.5 [IU]/h via INTRAVENOUS
  Filled 2023-01-28: qty 500

## 2023-01-28 MED ORDER — FENTANYL CITRATE (PF) 100 MCG/2ML IJ SOLN
100.0000 ug | INTRAMUSCULAR | Status: DC | PRN
  Administered 2023-01-29: 50 ug via INTRAVENOUS

## 2023-01-28 MED ORDER — SOD CITRATE-CITRIC ACID 500-334 MG/5ML PO SOLN: 30.0000 mL | ORAL | Status: DC | PRN

## 2023-01-28 NOTE — Patient Instructions (Signed)

## 2023-01-28 NOTE — MAU Note (Signed)
.  Dawn Blankenship is a 22 y.o. at [redacted]w[redacted]d here in MAU reporting being 5cm today in office. Membranes swept. Reports SROM at 1925 with cl fld. Good FM.   Onset of complaint: today Pain score: 10 Vitals:   01/28/23 2122 01/28/23 2124  BP:  (!) 143/85  Pulse: 76   Resp: 18   Temp: 97.8 F (36.6 C)   SpO2: 99%      FHT:147 Lab orders placed from triage:  mau labor eval

## 2023-01-28 NOTE — H&P (Addendum)
Dawn Blankenship is a 22 y.o. female G2P1001 with IUP at [redacted]w[redacted]d by LMP and early [place] presenting for ROM at 48.  She reports positive fetal movement. She denies vaginal bleeding. Seen in office today, was 4.5cms/80, membranes swept.  Ctx now q 2-4 minutes  Prenatal History/Complications: PNC at [facility] Pregnancy complications:  - Past Medical History: Past Medical History:  Diagnosis Date   Gestational hypertension     Past Surgical History: Past Surgical History:  Procedure Laterality Date   NO PAST SURGERIES      Obstetrical History: OB History     Gravida  2   Para  1   Term  1   Preterm      AB      Living  1      SAB      IAB      Ectopic      Multiple  0   Live Births  1            Social History: Social History   Socioeconomic History   Marital status: Significant Other    Spouse name: Not on file   Number of children: Not on file   Years of education: Not on file   Highest education level: Not on file  Occupational History   Not on file  Tobacco Use   Smoking status: Former   Smokeless tobacco: Never  Vaping Use   Vaping status: Former  Substance and Sexual Activity   Alcohol use: No   Drug use: No   Sexual activity: Yes    Birth control/protection: None  Other Topics Concern   Not on file  Social History Narrative   Not on file   Social Determinants of Health   Financial Resource Strain: Low Risk  (06/19/2022)   Overall Financial Resource Strain (CARDIA)    Difficulty of Paying Living Expenses: Not hard at all  Food Insecurity: No Food Insecurity (06/19/2022)   Hunger Vital Sign    Worried About Running Out of Food in the Last Year: Never true    Ran Out of Food in the Last Year: Never true  Transportation Needs: No Transportation Needs (06/19/2022)   PRAPARE - Administrator, Civil Service (Medical): No    Lack of Transportation (Non-Medical): No  Physical Activity: Insufficiently Active  (06/19/2022)   Exercise Vital Sign    Days of Exercise per Week: 4 days    Minutes of Exercise per Session: 30 min  Stress: No Stress Concern Present (06/19/2022)   [employer] of Occupational Health - Occupational Stress Questionnaire    Feeling of Stress : Not at all  Social Connections: Moderately Isolated (06/19/2022)   Social Connection and Isolation Panel [NHANES]    Frequency of Communication with Friends and Family: Twice a week    Frequency of Social Gatherings with Friends and Family: Three times a week    Attends Religious Services: Never    Active Member of Clubs or Organizations: No    Attends Banker Meetings: Never    Marital Status: Living with partner    Family History: Family History  Problem Relation Age of Onset   Cancer Mother        ovarian   Asthma Maternal Grandmother    Diabetes Maternal Grandfather    Diabetes Paternal Grandmother     Allergies: Allergies  Allergen Reactions   Cinnamon Anaphylaxis   Penicillins Hives and Itching    Medications Prior to  Admission  Medication Sig Dispense Refill Last Dose   aspirin 81 MG chewable tablet Chew 2 tablets (162 mg total) by mouth daily. 60 tablet 7    Prenatal Vit-Fe Fumarate-FA (PRENATAL VITAMIN PO) Take by mouth.       Review of Systems   Constitutional: Negative for fever and chills Eyes: Negative for visual disturbances Respiratory: Negative for shortness of breath, dyspnea Cardiovascular: Negative for chest pain or palpitations  Gastrointestinal: Negative for vomiting, diarrhea and constipation.  POSITIVE for abdominal pain (contractions) Genitourinary: Negative for dysuria and urgency Musculoskeletal: Negative for back pain, joint pain, myalgias  Neurological: Negative for dizziness and headaches  Blood pressure 131/74, pulse 76, temperature 97.8 F (36.6 C), resp. rate 18, height 5\' 4"  (1.626 m), weight 98 kg, last menstrual period 04/23/2022, SpO2 99%, not currently  breastfeeding. General appearance: alert, cooperative, and no distress Lungs: normal respiratory effort Heart: regular rate and rhythm Abdomen: soft, non-tender; bowel sounds normal Extremities: Homans sign is negative, no sign of DVT DTR's 2+ Presentation: cephalic Fetal monitoring  Baseline: 140 bpm, Variability: Good {> 6 bpm), Accelerations: Reactive, and Decelerations: Absent Uterine activity  2-4 Dilation: 5 Effacement (%): 80 Station: -2 Exam by:: Zenia Resides, RN [phone]   Prenatal labs: ABO, Rh: A/Positive/-- (01/17 1630) Antibody: Negative (05/01 0824) Rubella: 3.67 (01/17 1630) RPR: Non Reactive (05/01 0824)  HBsAg: Negative (01/17 1630)  HIV: Non Reactive (05/01 0824)  GBS: --Lottie Dawson (07/05 1400)    FAMILY TREE  RESULTS  Language English Pap 07/22/22 NILM  Initiated care at 8wks GC/CT Initial:  -/-          36wks:  Dating by LMP c/w 8wk [place]    Support person  Genetics NT/IT: neg/neg    AFP:      Panorama: LR female  BP cuff Has bp cuff Carrier Screen 10/23/20 neg    D[place]ell/Hgb Elec   Rhogam N/a    TDaP vaccine Declined 12/10/2022  Blood Type A/Positive/-- (01/17 1630)  Flu vaccine N/a Antibody Negative (01/17 1630)  Covid vaccine  HBsAg Negative (01/17 1630)    RPR Non Reactive (01/17 1630)  Anatomy US Boy, bilat RPD "Harrold Donath" Rubella  3.67 (01/17 1630)  Feeding Plan Bottle HIV Non Reactive (01/17 1630)  Contraception Mirena at [facility] visit Hep C neg  Circumcision Yes    Pediatrician [facility] A1C/GTT Early:      26-28wks: 83/125/116  Prenatal Classes       GBS     [ ]  PCN allergy  BTL Consent N/a    VBAC Consent N/a PHQ9 & GAD7  [ ] New OB  [ ] 28wks   [ ] 36wks  Waterbirth [ ] Class [ ]  36wkCNM visit/consent     Prenatal Transfer Tool  Maternal Diabetes: No Genetic Screening: Normal Maternal Ultrasounds/Referrals: Normal Fetal Ultrasounds or other Referrals:  Bilateral pylectasis:  7.60mm and 10mm w/hydronephrosis  Maternal Substance Abuse:  No Significant Maternal  Medications:  None Significant Maternal Lab Results: Group B Strep positive  No results found for this or any previous visit (from the past 24 hour(s)).  Assessment: Dawn Blankenship is a 22 y.o. G2P1001 with an IUP at [redacted]w[redacted]d presenting for labor/SROM  Plan: #Labor: expectant management #Pain:  Per request #FWB Cat 1 #ID: GBS: Ancef  #MOF:  bottle #MOC: OUTPT IUD #Circ: yes   Jacklyn Shell 01/28/2023, 10:43 PM

## 2023-01-28 NOTE — Progress Notes (Signed)
   LOW-RISK PREGNANCY VISIT Patient name: Dawn Blankenship MRN [id number]  Date of birth: [birth year] Chief Complaint:   Routine Prenatal Visit (NST)  History of Present Illness:   Dawn Blankenship is a 22 y.o. G39P1001 female at [redacted]w[redacted]d with an Estimated Date of Delivery: 01/28/23 being seen today for ongoing management of a low-risk pregnancy.  Today she reports no complaints. Contractions: Irregular.  .  Movement: Present. denies leaking of fluid. Review of Systems:   Pertinent items are noted in HPI Denies abnormal vaginal discharge w/ itching/odor/irritation, headaches, visual changes, shortness of breath, chest pain, abdominal pain, severe nausea/vomiting, or problems with urination or bowel movements unless otherwise stated above. Pertinent History Reviewed:  Reviewed past medical,surgical, social, obstetrical and family history.  Reviewed problem list, medications and allergies. Physical Assessment:   Vitals:   01/28/23 1557  BP: 138/79  Weight: 218 lb 6.4 oz (99.1 kg)  Body mass index is 37.49 kg/m.        Physical Examination:   General appearance: Well appearing, and in no distress  Mental status: Alert, oriented to person, place, and time  Skin: Warm & dry  Cardiovascular: Normal heart rate noted  Respiratory: Normal respiratory effort, no distress  Abdomen: Soft, gravid, nontender  Pelvic: Cervical exam performed  Dilation: 4.5 Effacement (%): 80 Station: -2  Extremities: Edema: Trace  Fetal Status:     Movement: Present Presentation: VertexNST: FHR baseline 150 bpm, Variability: moderate, Accelerations:present, Decelerations:  Absent= Cat 1/Reactive   Chaperone:  pt declined    No results found for this or any previous visit (from the past 24 hour(s)).  Assessment & Plan:    Pregnancy: G2P1001 at [redacted]w[redacted]d 1. Supervision of other normal pregnancy, antepartum   2. [redacted] weeks gestation of pregnancy Membranes stripped, IOL set for 41 + weeks     Meds: No  orders of the defined types were placed in this encounter.  Labs/procedures today: NST  Plan:IOL at 41 weeks, orders in    Reviewed: Term labor symptoms and general obstetric precautions including but not limited to vaginal bleeding, contractions, leaking of fluid and fetal movement were reviewed in detail with the patient.  All questions were answered. Has home bp cuff.. Check bp weekly, let us know if >140/90.   Follow-up: Return in about 1 week (around 02/04/2023) for LROB.  No future appointments.  No orders of the defined types were placed in this encounter.  Jacklyn Shell DNP, CNM 01/28/2023 4:54 PM

## 2023-01-29 ENCOUNTER — Encounter (HOSPITAL_COMMUNITY): Payer: Self-pay | Admitting: Family Medicine

## 2023-01-29 DIAGNOSIS — Z3A40 40 weeks gestation of pregnancy: Secondary | ICD-10-CM

## 2023-01-29 DIAGNOSIS — O99820 Streptococcus B carrier state complicating pregnancy: Secondary | ICD-10-CM | POA: Diagnosis not present

## 2023-01-29 DIAGNOSIS — O134 Gestational [pregnancy-induced] hypertension without significant proteinuria, complicating childbirth: Secondary | ICD-10-CM

## 2023-01-29 DIAGNOSIS — O480 Post-term pregnancy: Secondary | ICD-10-CM

## 2023-01-29 MED ORDER — FLEET ENEMA 7-19 GM/118ML RE ENEM: 1.0000 | ENEMA | Freq: Every day | RECTAL | Status: DC | PRN

## 2023-01-29 MED ORDER — DIBUCAINE (PERIANAL) 1 % EX OINT: 1.0000 | TOPICAL_OINTMENT | CUTANEOUS | Status: DC | PRN

## 2023-01-29 MED ORDER — MEASLES, MUMPS & RUBELLA VAC IJ SOLR: 0.5000 mL | Freq: Once | INTRAMUSCULAR | Status: DC

## 2023-01-29 MED ORDER — WITCH HAZEL-GLYCERIN EX PADS: 1.0000 | MEDICATED_PAD | CUTANEOUS | Status: DC | PRN

## 2023-01-29 MED ORDER — METHYLERGONOVINE MALEATE 0.2 MG/ML IJ SOLN: 0.2000 mg | INTRAMUSCULAR | Status: DC | PRN

## 2023-01-29 MED ORDER — MEDROXYPROGESTERONE ACETATE 150 MG/ML IM SUSP: 150.0000 mg | INTRAMUSCULAR | Status: DC | PRN

## 2023-01-29 MED ORDER — COCONUT OIL OIL: 1.0000 | TOPICAL_OIL | Status: DC | PRN

## 2023-01-29 MED ORDER — IBUPROFEN 600 MG PO TABS
600.0000 mg | ORAL_TABLET | Freq: Four times a day (QID) | ORAL | Status: DC
  Administered 2023-01-29 – 2023-01-31 (×10): 600 mg via ORAL
  Filled 2023-01-29 (×10): qty 1

## 2023-01-29 MED ORDER — ONDANSETRON HCL 4 MG/2ML IJ SOLN: 4.0000 mg | INTRAMUSCULAR | Status: DC | PRN

## 2023-01-29 MED ORDER — PRENATAL MULTIVITAMIN CH
1.0000 | ORAL_TABLET | Freq: Every day | ORAL | Status: DC
  Administered 2023-01-29 – 2023-01-31 (×3): 1 via ORAL
  Filled 2023-01-29 (×3): qty 1

## 2023-01-29 MED ORDER — FENTANYL CITRATE (PF) 100 MCG/2ML IJ SOLN
INTRAMUSCULAR | Status: AC
  Filled 2023-01-29: qty 2

## 2023-01-29 MED ORDER — ACETAMINOPHEN 325 MG PO TABS: 650.0000 mg | ORAL_TABLET | ORAL | Status: DC | PRN

## 2023-01-29 MED ORDER — METHYLERGONOVINE MALEATE 0.2 MG PO TABS: 0.2000 mg | ORAL_TABLET | ORAL | Status: DC | PRN

## 2023-01-29 MED ORDER — SENNOSIDES-DOCUSATE SODIUM 8.6-50 MG PO TABS
2.0000 | ORAL_TABLET | ORAL | Status: DC
  Administered 2023-01-29 – 2023-01-31 (×3): 2 via ORAL
  Filled 2023-01-29 (×3): qty 2

## 2023-01-29 MED ORDER — TETANUS-DIPHTH-ACELL PERTUSSIS 5-2.5-18.5 LF-MCG/0.5 IM SUSY: 0.5000 mL | PREFILLED_SYRINGE | Freq: Once | INTRAMUSCULAR | Status: DC

## 2023-01-29 MED ORDER — BENZOCAINE-MENTHOL 20-0.5 % EX AERO
1.0000 | INHALATION_SPRAY | CUTANEOUS | Status: DC | PRN
  Administered 2023-01-29: 1 via TOPICAL
  Filled 2023-01-29: qty 56

## 2023-01-29 MED ORDER — ONDANSETRON HCL 4 MG PO TABS: 4.0000 mg | ORAL_TABLET | ORAL | Status: DC | PRN

## 2023-01-29 MED ORDER — FERROUS SULFATE 325 (65 FE) MG PO TABS
325.0000 mg | ORAL_TABLET | ORAL | Status: DC
  Administered 2023-01-29 – 2023-01-31 (×2): 325 mg via ORAL
  Filled 2023-01-29 (×2): qty 1

## 2023-01-29 MED ORDER — DIPHENHYDRAMINE HCL 25 MG PO CAPS: 25.0000 mg | ORAL_CAPSULE | Freq: Four times a day (QID) | ORAL | Status: DC | PRN

## 2023-01-29 MED ORDER — SIMETHICONE 80 MG PO CHEW: 80.0000 mg | CHEWABLE_TABLET | ORAL | Status: DC | PRN

## 2023-01-29 MED ORDER — BISACODYL 10 MG RE SUPP: 10.0000 mg | Freq: Every day | RECTAL | Status: DC | PRN

## 2023-01-29 NOTE — Lactation Note (Signed)
This note was copied from a baby's chart. Lactation Consultation Note  Patient Name: Dawn Blankenship Date: 01/29/2023 Age:22 hours   Mom chooses to formula feed.  Maternal Data    Feeding    LATCH Score                    Lactation Tools Discussed/Used    Interventions    Discharge    Consult Status Consult Status: Complete    Chardonnay Holzmann G 01/29/2023, 4:12 AM

## 2023-01-29 NOTE — Discharge Summary (Signed)
Postpartum Discharge Summary  Date of Service updated***     Patient Name: Dawn Blankenship DOB: 2001/03/26 MRN: [id number]  Date of admission: 01/28/2023 Delivery date:01/29/2023 Delivering provider: Jacklyn Blankenship Date of discharge: 01/30/2023  Admitting diagnosis: Indication for care in labor or delivery [O75.9] Intrauterine pregnancy: [redacted]w[redacted]d     Secondary diagnosis:  Principal Problem:   Indication for care in labor or delivery Active Problems:   Vaginal delivery  Additional problems: none    Discharge diagnosis: Term Pregnancy Delivered                                              Post partum procedures: none Augmentation: none Complications: None  Hospital course: Onset of Labor With Vaginal Delivery      22 y.o. yo G2P1001 at [redacted]w[redacted]d was admitted in Active Labor on 01/28/2023. Labor course was complicated by nothing  Membrane Rupture Time/Date: 7:25 PM,01/28/2023  Delivery Method:Vaginal, Spontaneous Episiotomy: None Lacerations:  2nd degree Patient had a postpartum course complicated by elevated blood pressure.  She is ambulating, tolerating a regular diet, passing flatus, and urinating well. Patient is discharged home in stable condition on 01/30/23.  Newborn Data: Birth date:01/29/2023 Birth time:12:05 AM Gender:Female Living status:Living Apgars:8 ,9  Weight:3550 g  Magnesium Sulfate received: No BMZ received: No Rhophylac:N/A MMR:N/A T-DaP: declined Flu: N/A Transfusion:No  Physical exam  Vitals:   01/29/23 0800 01/29/23 1142 01/29/23 2037 01/30/23 0606  BP: 130/71 117/61 131/68 121/62  Pulse: 61 63 93 84  Resp: 17 16 17 16   Temp: 98.3 F (36.8 C) 98 F (36.7 C) 98.1 F (36.7 C) 97.6 F (36.4 C)  TempSrc: Oral Oral Oral Oral  SpO2:   98% 100%  Weight:      Height:       General: alert, cooperative, and no distress Lochia: appropriate Uterine Fundus: firm DVT Evaluation: No evidence of DVT seen on physical exam. Labs: Lab  Results  Component Value Date   WBC 20.7 (H) 01/28/2023   HGB 12.1 01/28/2023   HCT 36.2 01/28/2023   MCV 86.6 01/28/2023   PLT 307 01/28/2023      Latest Ref Rng & Units 07/22/2022    4:30 PM  CMP  Glucose 70 - 99 mg/dL 85   BUN 6 - 20 mg/dL 4   Creatinine 8.65 - 7.84 mg/dL 6.96   Sodium 295 - 284 mmol/L 139   Potassium 3.5 - 5.2 mmol/L 4.4   Chloride 96 - 106 mmol/L 104   CO2 20 - 29 mmol/L 19   Calcium 8.7 - 10.2 mg/dL 9.7   Total Protein 6.0 - 8.5 g/dL 6.8   Total Bilirubin 0.0 - 1.2 mg/dL <1.3   Alkaline Phos 44 - 121 IU/L 67   AST 0 - 40 IU/L 9   ALT 0 - 32 IU/L 11    Edinburgh Score:    01/29/2023    2:05 PM  Edinburgh Postnatal Depression Scale Screening Tool  I have been able to laugh and see the funny side of things. 0  I have looked forward with enjoyment to things. 0  I have blamed myself unnecessarily when things went wrong. 0  I have been anxious or worried for no good reason. 1  I have felt scared or panicky for no good reason. 1  Things have been getting on top of me.  1  I have been so unhappy that I have had difficulty sleeping. 0  I have felt sad or miserable. 0  I have been so unhappy that I have been crying. 0  The thought of harming myself has occurred to me. 0  Edinburgh Postnatal Depression Scale Total 3     After visit meds:  Allergies as of 01/30/2023       Reactions   Cinnamon Anaphylaxis   Penicillins Hives, Itching        Medication List     TAKE these medications    aspirin 81 MG chewable tablet Chew 2 tablets (162 mg total) by mouth daily.   furosemide 20 MG tablet Commonly known as: Lasix Take 1 tablet (20 mg total) by mouth daily for 5 days.   ibuprofen 600 MG tablet Commonly known as: ADVIL Take 1 tablet (600 mg total) by mouth every 6 (six) hours.   NIFEdipine 30 MG 24 hr tablet Commonly known as: Procardia XL Take 1 tablet (30 mg total) by mouth daily.   PRENATAL VITAMIN PO Take by mouth.          Discharge home in stable condition Infant Feeding: Bottle Infant Disposition:home with mother Discharge instruction: per After Visit Summary and Postpartum booklet. Activity: Advance as tolerated. Pelvic rest for 6 weeks.  Diet: routine diet Future Appointments: Future Appointments  Date Time Provider Department Center  02/03/2023  2:10 PM Arabella Merles, CNM [facility]   Follow up Visit:  Follow-up Information     [facility] Follow up in 1 week(s).   Specialty: Obstetrics and Gynecology Why: blood pressure check Contact information: [address] [place] [address] [phone]                 Please schedule this patient for a In person postpartum visit in 4 weeks with the following provider: Any provider. Additional Postpartum F/U:  Low risk pregnancy complicated by:  Delivery mode:  Vaginal, Spontaneous Anticipated Birth Control:  IUD   01/30/2023 Reva Bores, MD

## 2023-01-30 ENCOUNTER — Other Ambulatory Visit (HOSPITAL_COMMUNITY): Payer: Self-pay

## 2023-01-30 MED ORDER — NIFEDIPINE ER 30 MG PO TB24
30.0000 mg | ORAL_TABLET | Freq: Every day | ORAL | 1 refills | Status: AC
  Filled 2023-01-30: qty 30, 30d supply, fill #0

## 2023-01-30 MED ORDER — FUROSEMIDE 20 MG PO TABS
20.0000 mg | ORAL_TABLET | Freq: Every day | ORAL | 0 refills | Status: AC
  Filled 2023-01-30: qty 5, 5d supply, fill #0

## 2023-01-30 MED ORDER — NIFEDIPINE ER OSMOTIC RELEASE 30 MG PO TB24
30.0000 mg | ORAL_TABLET | Freq: Once | ORAL | Status: AC
  Administered 2023-01-30: 30 mg via ORAL
  Filled 2023-01-30: qty 1

## 2023-01-30 MED ORDER — FUROSEMIDE 20 MG PO TABS
20.0000 mg | ORAL_TABLET | Freq: Once | ORAL | Status: AC
  Administered 2023-01-30: 20 mg via ORAL
  Filled 2023-01-30: qty 1

## 2023-01-30 MED ORDER — IBUPROFEN 600 MG PO TABS
600.0000 mg | ORAL_TABLET | Freq: Four times a day (QID) | ORAL | 0 refills | Status: AC
  Filled 2023-01-30: qty 30, 8d supply, fill #0

## 2023-01-30 NOTE — Progress Notes (Addendum)
Post Partum Day 1 Subjective: no complaints, up ad lib, and voiding  Objective: Blood pressure 121/62, pulse 84, temperature 97.6 F (36.4 C), temperature source Oral, resp. rate 16, height 5\' 4"  (1.626 m), weight 98 kg, last menstrual period 04/23/2022, SpO2 100%, not currently breastfeeding.  Physical Exam:  General: alert, cooperative, and appears stated age Lochia: appropriate Uterine Fundus: firm DVT Evaluation: No evidence of DVT seen on physical exam.  Recent Labs    01/28/23 2309  HGB 12.1  HCT 36.2    Assessment/Plan: Discharge home, Circumcision prior to discharge, and Contraception outpt IUD Places to have your son circumcised (for self-pay patients//Medicaid now covers circumcisions):                                                                      [facility]     [phone]   $480 while you are in hospital         [facility]              [phone]   $[phone] by 4 wks                      [facility]                     [phone]   $269 by 7 days [facility]                    [phone]   $269 by 4 wks [facility]             [phone]   $225 by 2 wks    These prices sometimes change but are roughly what you can expect to pay. Please call and confirm pricing.   There are many reasons parents decide to have their sons circumsized. During the first year of life circumcised males have a reduced risk of urinary tract infections but after this year the rates between circumcised males and uncircumcised males are the same.  It can reduce rate of HIV and other STI infection later in life.  It is safe to have your son circumcised outside of the hospital and the places above perform them regularly.   Deciding about Circumcision in Baby Boys  (Up-to-date The Basics)  What is circumcision?   Circumcision is a surgery that removes the skin that covers the tip of the penis, called the "foreskin" Circumcision is usually done when a boy is between 75 and 31 days old. In the [place],  circumcision is common. In some other countries, fewer boys are circumcised. Circumcision is a common tradition in some religions.  Should I have my baby boy circumcised?   There is no easy answer. Circumcision has some benefits. But it also has risks. After talking with your doctor, you will have to decide for yourself what is right for your family.  What are the benefits of circumcision?   Circumcised boys seem to have slightly lower rates of: ?Urinary tract infections ?Swelling of the opening at the tip of the penis Circumcised men seem to have slightly lower rates of: ?Urinary tract infections ?Swelling of the opening at the tip of the penis ?Penis cancer ?HIV and other infections that you catch  during sex, or transmit to a future partner(s) ?Cervical cancer in the women they have sex with Even so, in the [place], the risks of these problems are small - even in boys and men who have not been circumcised. Plus, boys and men who are not circumcised can reduce these extra risks by: ?Cleaning their penis well ?Using condoms during sex  What are the risks of circumcision?  Risks include: ?Bleeding or infection from the surgery ?Damage to or amputation of the penis ?A chance that the doctor will cut off too much or not enough of the foreskin ?A chance that sex won't feel as good later in life Only about 1 out of every 200 circumcisions leads to problems. There is also a chance that your health insurance won't pay for circumcision.  How is circumcision done in baby boys?  First, the baby gets medicine for pain relief. This might be a cream on the skin or a shot into the base of the penis. Next, the doctor cleans the baby's penis well. Then he or she uses special tools to cut off the foreskin. Finally, the doctor wraps a bandage (called gauze) around the baby's penis. If you have your baby circumcised, his doctor or nurse will give you instructions on how to care for him after the  surgery. It is important that you follow those instructions carefully.   LOS: 2 days   Reva Bores, MD 01/30/2023, 7:53 AM

## 2023-02-03 ENCOUNTER — Encounter: Payer: Medicaid Other | Admitting: Advanced Practice Midwife

## 2023-02-06 ENCOUNTER — Inpatient Hospital Stay (HOSPITAL_COMMUNITY)
Admission: RE | Admit: 2023-02-06 | Payer: Medicaid Other | Source: Home / Self Care | Admitting: Obstetrics & Gynecology

## 2023-02-06 ENCOUNTER — Inpatient Hospital Stay (HOSPITAL_COMMUNITY): Payer: Medicaid Other

## 2023-02-23 ENCOUNTER — Telehealth (HOSPITAL_COMMUNITY): Payer: Self-pay

## 2023-02-23 NOTE — Telephone Encounter (Signed)
02/23/2023 1505  Name: Dawn Blankenship MRN: [id number] DOB: Jul 08, 2000  Reason for Call:  Transition of Care Hospital Discharge Call  Contact Status: Patient Contact Status: Complete  Language assistant needed: Interpreter Mode: Interpreter Not Needed        Follow-Up Questions: Do You Have Any Concerns About Your Health As You Heal From Delivery?: No Do You Have Any Concerns About Your Infants Health?: No  Edinburgh Postnatal Depression Scale:  In the Past 7 Days: I have been able to laugh and see the funny side of things.: As much as I always could I have looked forward with enjoyment to things.: As much as I ever did I have blamed myself unnecessarily when things went wrong.: No, never I have been anxious or worried for no good reason.: Hardly ever I have felt scared or panicky for no good reason.: No, not at all Things have been getting on top of me.: No, I have been coping as well as ever I have been so unhappy that I have had difficulty sleeping.: Not at all I have felt sad or miserable.: No, not at all I have been so unhappy that I have been crying.: No, never The thought of harming myself has occurred to me.: Never Inocente Salles Postnatal Depression Scale Total: 1  PHQ2-9 Depression Scale:     Discharge Follow-up: Edinburgh score requires follow up?: No Patient was advised of the following resources:: Support Group, Breastfeeding Support Group Did patient express any COVID concerns?: No  Post-discharge interventions: Reviewed Newborn Safe Sleep Practices  Signature  Signe Colt

## 2023-03-09 ENCOUNTER — Ambulatory Visit (INDEPENDENT_AMBULATORY_CARE_PROVIDER_SITE_OTHER): Payer: Medicaid Other | Admitting: Women's Health

## 2023-03-09 ENCOUNTER — Encounter: Payer: Self-pay | Admitting: Women's Health

## 2023-03-09 DIAGNOSIS — Z8759 Personal history of other complications of pregnancy, childbirth and the puerperium: Secondary | ICD-10-CM

## 2023-03-09 DIAGNOSIS — Z3202 Encounter for pregnancy test, result negative: Secondary | ICD-10-CM

## 2023-03-09 DIAGNOSIS — Z30430 Encounter for insertion of intrauterine contraceptive device: Secondary | ICD-10-CM | POA: Insufficient documentation

## 2023-03-09 LAB — POCT URINE PREGNANCY: Preg Test, Ur: NEGATIVE

## 2023-03-09 MED ORDER — LEVONORGESTREL 20.1 MCG/DAY IU IUD
1.0000 | INTRAUTERINE_SYSTEM | Freq: Once | INTRAUTERINE | Status: AC
Start: 2023-03-09 — End: 2023-03-09
  Administered 2023-03-09: 1 via INTRAUTERINE

## 2023-03-09 NOTE — Addendum Note (Signed)
Addended by: Federico Flake A on: 03/09/2023 03:19 PM   Modules accepted: Orders

## 2023-03-09 NOTE — Patient Instructions (Signed)
 Nothing in vagina for 3 days (no sex, douching, tampons, etc...) Check your strings once a month to make sure you can feel them, if you are not able to please let us know If you develop a fever of 100.4 or more in the next few weeks, or if you develop severe abdominal pain, please let [place] know Use a backup method of birth control, such as condoms, for 2 weeks   Intrauterine Device Information An intrauterine device (IUD) is a medical device that is inserted into the uterus to prevent pregnancy. It is a small, T-shaped device that has one or two nylon strings hanging down from it. The strings hang out of the lower part of the uterus (cervix) to allow for future IUD removal. There are two types of IUDs: Hormone IUD. This type of IUD is made of plastic and contains the hormone progestin (synthetic progesterone). A hormone IUD may last 3-5 years. Copper IUD. This type of IUD has copper wire wrapped around it. A copper IUD may last up to 10 years. How is an IUD inserted? An IUD is inserted through the vagina, through the cervix, and into the uterus with a minor medical procedure. The procedure for IUD insertion may vary among health care providers and hospitals. How does an IUD work? Synthetic progesterone in a hormonal IUD prevents pregnancy by: Thickening cervical mucus to prevent sperm from entering the uterus. Thinning the uterine lining to prevent a fertilized egg from being implanted there. Copper in a copper IUD prevents pregnancy by making the uterus and fallopian tubes produce a fluid that kills sperm. What are the advantages of an IUD? Advantages of either type of IUD An IUD: Is highly effective in preventing pregnancy. Is reversible. You can become pregnant shortly after the IUD is removed. Is low-maintenance and can stay in place for a long time. Has no estrogen-related side effects. Can be used when breastfeeding. Is not associated with weight gain. Can be inserted right after  childbirth, an abortion, or a miscarriage. Advantages of a hormone IUD If it is inserted within 7 days of your period starting, it works right after it has been inserted. If the hormone IUD is inserted at any other time in your cycle, you will need to use a backup method of birth control for 7 days after insertion. It can make menstrual periods lighter or stop completely. It can reduce menstrual cramping and other discomforts from menstrual periods. It can be used for 3-5 years, depending on which IUD you have. Advantages of a copper IUD It works right after it is inserted. It can be used as a form of emergency birth control if it is inserted within 5 days after having unprotected sex. It does not interfere with your body's natural hormones. It can be used for up to 10 years. What are the disadvantages of an IUD? An IUD may cause irregular menstrual bleeding for a period of time after insertion. It is common to have pain during insertion and have cramping and vaginal bleeding after insertion. An IUD may cut the uterus (uterine perforation) when it is inserted. This is rare. Pelvic inflammatory disease (PID) may happen after insertion of an IUD. PID is an infection in the uterus and fallopian tubes. The IUD does not cause the infection. The infection is usually from an unknown sexually transmitted infection (STI). This is rare, and it usually happens during the first 20 days after the IUD is inserted. A copper IUD can make your menstrual flow  heavier and more painful. IUDs cannot prevent sexually transmitted infections (STIs). How is an IUD removed?  You will lie on your back with your knees bent and your feet in footrests (stirrups). A device will be inserted into your vagina to spread apart the vaginal walls (speculum). This will allow your health care provider to see the strings attached to the IUD. Your health care provider will use a small instrument (forceps) to grasp the IUD strings and  will pull firmly until the IUD is removed. You may have some discomfort when the IUD is removed. Your health care provider may recommend taking over-the-counter pain relievers, such as ibuprofen, before the procedure. You may also have minor spotting for a few days after the procedure. The procedure for IUD removal may vary among health care providers and hospitals. Is an IUD right for me? If you are interested in an IUD, discuss it with your health care provider. He or she will make sure you are a good candidate for an IUD and will let you know more about the advantages, disadvantage, and possible side effects. This will allow you to make a decision about the device. Summary An intrauterine device (IUD) is a medical device that is inserted in the uterus to prevent pregnancy. It is a small, T-shaped device that has one or two nylon strings hanging down from it. A hormone IUD contains the hormone progestin (synthetic progesterone). A copper IUD has copper wire wrapped around it. Synthetic progesterone in a hormone IUD prevents pregnancy by thickening cervical mucus and thinning the walls of the uterus. Copper in a copper IUD prevents pregnancy by making the uterus and fallopian tubes produce a fluid that kills sperm. A hormone IUD can be left in place for 3-5 years. A copper IUD can be left in place for up to 10 years. An IUD is inserted and removed by a health care provider. You may feel some pain during insertion and removal. Your health care provider may recommend taking over-the-counter pain medicine, such as ibuprofen, before an IUD procedure. This information is not intended to replace advice given to you by your health care provider. Make sure you discuss any questions you have with your health care provider. Document Revised: 01/03/2020 Document Reviewed: 01/03/2020 Elsevier Patient Education  2024 [employer].

## 2023-03-09 NOTE — Progress Notes (Signed)
POSTPARTUM VISIT Patient name: Dawn Blankenship MRN [id number]  Date of birth: October 14, 2000 Chief Complaint:   Postpartum Care (IUD insertion)  History of Present Illness:   Dawn Blankenship is a 22 y.o. G9P2002 Caucasian female being seen today for a postpartum visit. She is 5 weeks postpartum following a spontaneous vaginal delivery at 40.1 gestational weeks. IOL: no, for n/a. Anesthesia:  local for repair .  Laceration: 2nd degree.  Complications: none. Inpatient contraception: no.   Pregnancy uncomplicated. Tobacco use: no. Substance use disorder: no. Last pap smear: 07/22/22 and results were NILM w/ HRHPV not done. Next pap smear due: 2027 Patient's last menstrual period was 03/03/2023.  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 Bleeding none. Bowel function is normal. Bladder function is normal. Urinary incontinence? no, fecal incontinence? no Patient is not sexually active. Last sexual activity: prior to birth of baby. Desired contraception: IUD. Patient does not want a pregnancy in the future.  Desired family size is 2 children.   Upstream - 03/09/23 1412       Pregnancy Intention Screening   Does the patient want to become pregnant in the next year? No    Does the patient's partner want to become pregnant in the next year? No    Would the patient like to discuss contraceptive options today? No      Contraception Wrap Up   Current Method IUD or IUS    End Method IUD or IUS    Contraception Counseling Provided No            The pregnancy intention screening data noted above was reviewed. Potential methods of contraception were discussed. The patient elected to proceed with IUD or IUS.  Edinburgh Postpartum Depression Screening: negative  Edinburgh Postnatal Depression Scale - 03/09/23 1354       Edinburgh Postnatal Depression Scale:  In the Past 7 Days   I have been able to laugh and see  the funny side of things. 0    I have looked forward with enjoyment to things. 0    I have blamed myself unnecessarily when things went wrong. 0    I have been anxious or worried for no good reason. 0    I have felt scared or panicky for no good reason. 1    Things have been getting on top of me. 0    I have been so unhappy that I have had difficulty sleeping. 0    I have felt sad or miserable. 0    I have been so unhappy that I have been crying. 0    The thought of harming myself has occurred to me. 0    Edinburgh Postnatal Depression Scale Total 1                11/04/2022    9:25 AM 07/22/2022   11:54 AM 02/24/2021    9:12 AM 10/23/2020   11:05 AM  GAD 7 : Generalized Anxiety Score  Nervous, Anxious, on Edge 0 1 1 0  Control/stop worrying 0 0 0 1  Worry too much - different things 0 0 0 1  Trouble relaxing 0 0 1 1  Restless 0 0 0 0  Easily annoyed or irritable 1 1 1 1   Afraid - awful might happen 0 0 0 0  Total GAD 7 Score 1 2 3 4      Baby's course has  been uncomplicated. Baby is feeding by bottle. Infant has a pediatrician/family doctor? Yes.  Childcare strategy if returning to work/school: n/a-stay at home mom.  Pt has material needs met for her and baby: Yes.   Review of Systems:   Pertinent items are noted in HPI Denies Abnormal vaginal discharge w/ itching/odor/irritation, headaches, visual changes, shortness of breath, chest pain, abdominal pain, severe nausea/vomiting, or problems with urination or bowel movements. Pertinent History Reviewed:  Reviewed past medical,surgical, obstetrical and family history.  Reviewed problem list, medications and allergies. OB History  Gravida Para Term Preterm AB Living  2 2 2     2   SAB IAB Ectopic Multiple Live Births        0 2    # Outcome Date GA Lbr Len/2nd Weight Sex Type Anes PTL Lv  2 Term 01/29/23 [redacted]w[redacted]d  7 lb 13.2 oz (3.55 kg) M Vag-Spont None  LIV  1 Term 05/14/21 [redacted]w[redacted]d / 00:22 7 lb 0.5 oz (3.19 kg) F Vag-Spont EPI,  Local N LIV     Complications: Gestational hypertension   Physical Assessment:   Vitals:   03/09/23 1351  BP: 138/83  Pulse: 82  Weight: 201 lb (91.2 kg)  Height: 5\' 4"  (1.626 m)  Body mass index is 34.5 kg/m.       Physical Examination:   General appearance: alert, well appearing, and in no distress  Mental status: alert, oriented to person, place, and time  Skin: warm & dry   Cardiovascular: normal heart rate noted   Respiratory: normal respiratory effort, no distress   Breasts: deferred, no complaints   Abdomen: soft, non-tender   Pelvic: VULVA: normal appearing vulva with no masses, tenderness or lesions, VAGINA: normal appearing vagina with normal color and discharge, no lesions, lac well healed CERVIX: normal appearing cervix without discharge or lesions. Thin prep pap obtained: No  Rectal: not examined  Extremities: Edema: none        Results for orders placed or performed in visit on 03/09/23 (from the past 24 hour(s))  POCT urine pregnancy   Collection Time: 03/09/23  2:15 PM  Result Value Ref Range   Preg Test, Ur Negative Negative     IUD INSERTION The risks and benefits of the method and placement have been thouroughly reviewed with the patient and all questions were answered.  Specifically the patient is aware of failure rate of 07/998, expulsion of the IUD and of possible perforation.  The patient is aware of irregular bleeding due to the method and understands the incidence of irregular bleeding diminishes with time.  Signed copy of informed consent in chart.   Time out was performed.  A graves speculum was placed in the vagina.  The cervix was visualized, prepped using Betadine, and grasped with a single tooth tenaculum. The uterus was found to be neutral and it sounded to 9 cm.  Liletta  IUD placed per manufacturer's recommendations. The strings were trimmed to approximately 3 cm. The patient tolerated the procedure well.   Informal transvaginal sonogram was  performed and the proper placement of the IUD was verified.  Chaperone: Office manager & Plan:  1) Postpartum exam 2) 5 wks s/p spontaneous vaginal delivery 3) bottle feeding 4) Depression screening 5) Resolved PPHTN> ok to stay off meds 6) Liletta IUD insertion The patient was given post procedure instructions, including signs and symptoms of infection and to check for the strings after each menses or each month, and refraining from intercourse  or anything in the vagina for 3 days.  Condoms for 2 weeks. She was given a care card with date IUD placed, and date IUD to be removed.  She is scheduled for a f/u appointment in 4 weeks.  Essential components of care per ACOG recommendations:  1.  Mood and well being:  If positive depression screen, discussed and plan developed.  If using tobacco we discussed reduction/cessation and risk of relapse If current substance abuse, we discussed and referral to local resources was offered.   2. Infant care and feeding:  If breastfeeding, discussed returning to work, pumping, breastfeeding-associated pain, guidance regarding return to fertility while lactating if not using another method. If needed, patient was provided with a letter to be allowed to pump q 2-3hrs to support lactation in a private location with access to a refrigerator to store breastmilk.   Recommended that all caregivers be immunized for flu, pertussis and other preventable communicable diseases If pt does not have material needs met for her/baby, referred to local resources for help obtaining these.  3. Sexuality, contraception and birth spacing Provided guidance regarding sexuality, management of dyspareunia, and resumption of intercourse Discussed avoiding interpregnancy interval <8mths and recommended birth spacing of 18 months  4. Sleep and fatigue Discussed coping options for fatigue and sleep disruption Encouraged family/partner/community support of 4 hrs of  uninterrupted sleep to help with mood and fatigue  5. Physical recovery  If pt had a C/S, assessed incisional pain and providing guidance on normal vs prolonged recovery If pt had a laceration, perineal healing and pain reviewed.  If urinary or fecal incontinence, discussed management and referred to PT or uro/gyn if indicated  Patient is safe to resume physical activity. Discussed attainment of healthy weight.  6.  Chronic disease management Discussed pregnancy complications if any, and their implications for future childbearing and long-term maternal health. Review recommendations for prevention of recurrent pregnancy complications, such as 17 hydroxyprogesterone caproate to reduce risk for recurrent PTB not applicable, or aspirin to reduce risk of preeclampsia did not discuss. Pt had GDM: No. If yes, 2hr GTT scheduled: not applicable. Reviewed medications and non-pregnant dosing including consideration of whether pt is breastfeeding using a reliable resource such as LactMed: not applicable Referred for f/u w/ PCP or subspecialist providers as indicated: not applicable  7. Health maintenance Mammogram at 22yo or earlier if indicated Pap smears as indicated  Meds: No orders of the defined types were placed in this encounter.   Follow-up: Return in about 4 weeks (around 04/06/2023) for IUD f/u, in person.   Orders Placed This Encounter  Procedures   POCT urine pregnancy    Cheral Marker CNM, [facility] 03/09/2023 2:34 PM

## 2023-04-06 ENCOUNTER — Encounter: Payer: Self-pay | Admitting: Women's Health

## 2023-04-06 ENCOUNTER — Ambulatory Visit: Payer: Medicaid Other | Admitting: Women's Health

## 2023-04-06 VITALS — BP 125/65 | HR 95 | Ht 64.0 in | Wt 205.0 lb

## 2023-04-06 DIAGNOSIS — Z30431 Encounter for routine checking of intrauterine contraceptive device: Secondary | ICD-10-CM | POA: Diagnosis not present

## 2023-04-06 NOTE — Progress Notes (Signed)
GYN VISIT Patient name: Dawn Blankenship MRN [id number]  Date of birth: 05/26/2001 Chief Complaint:   IUD follow-up  History of Present Illness:   Dawn Blankenship is a 22 y.o. G38P2002 Caucasian female being seen today for IUD check.  Liletta inserted 03/09/23 at pp visit.   No problems, able to feel strings when checked for them.  Some occ bleeding, last time x7d.  Patient's last menstrual period was 03/03/2023. The current method of family planning is IUD.  Last pap 07/22/22. Results were: NILM w/ HRHPV not done     11/04/2022    9:23 AM 07/22/2022   11:54 AM 02/24/2021    9:12 AM 10/23/2020   11:05 AM 09/13/2020   11:00 AM  Depression screen PHQ 2/9  Decreased Interest 0 0 0 0 1  Down, Depressed, Hopeless 0 0 0 1 0  PHQ - 2 Score 0 0 0 1 1  Altered sleeping 0 1 1 1 1   Tired, decreased energy 0 1 0 1 1  Change in appetite 0 0 0 0 0  Feeling bad or failure about yourself  0 0 0 1 0  Trouble concentrating 0 0 0 1 1  Moving slowly or fidgety/restless 0 0 0 0 0  Suicidal thoughts 0 0 0 0 0  PHQ-9 Score 0 2 1 5 4         11/04/2022    9:25 AM 07/22/2022   11:54 AM 02/24/2021    9:12 AM 10/23/2020   11:05 AM  GAD 7 : Generalized Anxiety Score  Nervous, Anxious, on Edge 0 1 1 0  Control/stop worrying 0 0 0 1  Worry too much - different things 0 0 0 1  Trouble relaxing 0 0 1 1  Restless 0 0 0 0  Easily annoyed or irritable 1 1 1 1   Afraid - awful might happen 0 0 0 0  Total GAD 7 Score 1 2 3 4      Review of Systems:   Pertinent items are noted in HPI Denies fever/chills, dizziness, headaches, visual disturbances, fatigue, shortness of breath, chest pain, abdominal pain, vomiting, abnormal vaginal discharge/itching/odor/irritation, problems with periods, bowel movements, urination, or intercourse unless otherwise stated above.  Pertinent History Reviewed:  Reviewed past medical,surgical, social, obstetrical and family history.  Reviewed problem list, medications and  allergies. Physical Assessment:   Vitals:   04/06/23 1614  BP: 125/65  Pulse: 95  Weight: 205 lb (93 kg)  Height: 5\' 4"  (1.626 m)  Body mass index is 35.19 kg/m.       Physical Examination:   General appearance: alert, well appearing, and in no distress  Mental status: alert, oriented to person, place, and time  Skin: warm & dry   Cardiovascular: normal heart rate noted  Respiratory: normal respiratory effort, no distress  Abdomen: soft, non-tender   Pelvic: by Everlena Cooper, [facility] VULVA: normal appearing vulva with no masses, tenderness or lesions, VAGINA: normal appearing vagina with normal color and discharge, no lesions, CERVIX: normal appearing cervix without discharge or lesions, IUD strings visible and appropriate length  Extremities: no edema   Chaperone:  me     No results found for this or any previous visit (from the past 24 hour(s)).  Assessment & Plan:  1) IUD check> in place, doing well  Meds: No orders of the defined types were placed in this encounter.  No orders of the defined types were placed in this encounter.  Return in about 1 year (  around 04/05/2024) for Physical.  Cheral Marker CNM, [facility] 04/06/2023 4:40 PM
